# Patient Record
Sex: Female | Born: 1955 | Race: White | Hispanic: No | Marital: Married | Smoking: Never smoker
Health system: Southern US, Community
[De-identification: ages and names within clinical notes are randomized; demographics above are authoritative.]

## PROBLEM LIST (undated history)

## (undated) DIAGNOSIS — I10 Essential (primary) hypertension: Secondary | ICD-10-CM

## (undated) DIAGNOSIS — M1990 Unspecified osteoarthritis, unspecified site: Secondary | ICD-10-CM

## (undated) HISTORY — DX: Unspecified osteoarthritis, unspecified site: M19.90

## (undated) HISTORY — DX: Essential (primary) hypertension: I10

---

## 1999-09-13 ENCOUNTER — Encounter (INDEPENDENT_AMBULATORY_CARE_PROVIDER_SITE_OTHER): Payer: Self-pay | Admitting: Specialist

## 1999-09-13 ENCOUNTER — Other Ambulatory Visit: Admission: RE | Admit: 1999-09-13 | Discharge: 1999-09-13 | Payer: Self-pay | Admitting: Obstetrics and Gynecology

## 1999-10-05 ENCOUNTER — Ambulatory Visit (HOSPITAL_COMMUNITY): Admission: RE | Admit: 1999-10-05 | Discharge: 1999-10-05 | Payer: Self-pay | Admitting: Obstetrics and Gynecology

## 1999-10-05 ENCOUNTER — Encounter: Payer: Self-pay | Admitting: Obstetrics and Gynecology

## 2002-08-14 ENCOUNTER — Other Ambulatory Visit: Admission: RE | Admit: 2002-08-14 | Discharge: 2002-08-14 | Payer: Self-pay | Admitting: Obstetrics and Gynecology

## 2003-11-03 ENCOUNTER — Other Ambulatory Visit: Admission: RE | Admit: 2003-11-03 | Discharge: 2003-11-03 | Payer: Self-pay | Admitting: Obstetrics and Gynecology

## 2004-11-17 ENCOUNTER — Other Ambulatory Visit: Admission: RE | Admit: 2004-11-17 | Discharge: 2004-11-17 | Payer: Self-pay | Admitting: Obstetrics and Gynecology

## 2005-11-09 ENCOUNTER — Ambulatory Visit: Payer: Self-pay | Admitting: Internal Medicine

## 2005-11-16 ENCOUNTER — Ambulatory Visit: Payer: Self-pay | Admitting: Internal Medicine

## 2008-11-10 ENCOUNTER — Telehealth: Payer: Self-pay | Admitting: Internal Medicine

## 2008-11-17 ENCOUNTER — Ambulatory Visit: Payer: Self-pay | Admitting: Internal Medicine

## 2008-11-24 ENCOUNTER — Ambulatory Visit: Payer: Self-pay | Admitting: Internal Medicine

## 2008-11-24 LAB — HM COLONOSCOPY: HM Colonoscopy: NORMAL

## 2009-11-29 HISTORY — PX: DILATION AND CURETTAGE OF UTERUS: SHX78

## 2009-12-17 ENCOUNTER — Ambulatory Visit (HOSPITAL_COMMUNITY): Admission: RE | Admit: 2009-12-17 | Discharge: 2009-12-17 | Payer: Self-pay | Admitting: Obstetrics and Gynecology

## 2009-12-17 LAB — CONVERTED CEMR LAB: Pap Smear: NORMAL

## 2010-07-01 LAB — HM MAMMOGRAPHY: HM Mammogram: NORMAL

## 2010-07-27 ENCOUNTER — Ambulatory Visit: Payer: Self-pay | Admitting: Internal Medicine

## 2010-08-01 LAB — HM PAP SMEAR: HM Pap smear: NORMAL

## 2010-08-10 ENCOUNTER — Encounter: Payer: Self-pay | Admitting: Internal Medicine

## 2010-08-10 ENCOUNTER — Other Ambulatory Visit: Payer: Self-pay | Admitting: Internal Medicine

## 2010-08-10 ENCOUNTER — Ambulatory Visit: Payer: Self-pay | Admitting: Internal Medicine

## 2010-08-10 DIAGNOSIS — M542 Cervicalgia: Secondary | ICD-10-CM | POA: Insufficient documentation

## 2010-08-10 LAB — CBC WITH DIFFERENTIAL/PLATELET
Basophils Absolute: 0.1 K/uL (ref 0.0–0.1)
Basophils Relative: 1.2 % (ref 0.0–3.0)
Eosinophils Absolute: 0.2 K/uL (ref 0.0–0.7)
Eosinophils Relative: 4 % (ref 0.0–5.0)
HCT: 39.8 % (ref 36.0–46.0)
Hemoglobin: 13.7 g/dL (ref 12.0–15.0)
Lymphocytes Relative: 29.9 % (ref 12.0–46.0)
Lymphs Abs: 1.3 K/uL (ref 0.7–4.0)
MCHC: 34.4 g/dL (ref 30.0–36.0)
MCV: 95.5 fl (ref 78.0–100.0)
Monocytes Absolute: 0.3 K/uL (ref 0.1–1.0)
Monocytes Relative: 6.8 % (ref 3.0–12.0)
Neutro Abs: 2.5 K/uL (ref 1.4–7.7)
Neutrophils Relative %: 58.1 % (ref 43.0–77.0)
Platelets: 247 K/uL (ref 150.0–400.0)
RBC: 4.16 Mil/uL (ref 3.87–5.11)
RDW: 12.7 % (ref 11.5–14.6)
WBC: 4.3 K/uL — ABNORMAL LOW (ref 4.5–10.5)

## 2010-08-10 LAB — HEPATIC FUNCTION PANEL
ALT: 19 U/L (ref 0–35)
AST: 20 U/L (ref 0–37)
Albumin: 4.1 g/dL (ref 3.5–5.2)
Alkaline Phosphatase: 64 U/L (ref 39–117)
Bilirubin, Direct: 0.1 mg/dL (ref 0.0–0.3)
Total Bilirubin: 1.2 mg/dL (ref 0.3–1.2)
Total Protein: 6.5 g/dL (ref 6.0–8.3)

## 2010-08-10 LAB — BASIC METABOLIC PANEL
BUN: 16 mg/dL (ref 6–23)
CO2: 30 mEq/L (ref 19–32)
Calcium: 9.3 mg/dL (ref 8.4–10.5)
Chloride: 105 mEq/L (ref 96–112)
Creatinine, Ser: 0.6 mg/dL (ref 0.4–1.2)
GFR: 105.41 mL/min (ref >60.00)
Glucose, Bld: 84 mg/dL (ref 70–99)
Potassium: 4.6 mEq/L (ref 3.5–5.1)
Sodium: 142 mEq/L (ref 135–145)

## 2010-08-10 LAB — LIPID PANEL
Cholesterol: 211 mg/dL — ABNORMAL HIGH (ref 0–200)
HDL: 108.3 mg/dL
Total CHOL/HDL Ratio: 2
Triglycerides: 49 mg/dL (ref 0.0–149.0)
VLDL: 9.8 mg/dL (ref 0.0–40.0)

## 2010-08-10 LAB — LDL CHOLESTEROL, DIRECT: Direct LDL: 93.8 mg/dL

## 2010-08-10 LAB — TSH: TSH: 2.79 u[IU]/mL (ref 0.35–5.50)

## 2010-08-10 NOTE — Progress Notes (Signed)
Subjective:     Patient ID: Melissa Dawson is a 55 y.o. female.  HPI Patient is here for physical exam. She feels well on side of neck discomfort. She has some posterior neck discomfort for several months. The following portions of the patient's history were reviewed and updated as appropriate: allergies, current medications, past family history, past medical history, past social history, past surgical history and problem list.  Review of Systems  Constitutional: Negative for activity change, appetite change and fatigue.  HENT: Negative for hearing loss, nosebleeds, congestion, neck pain and sinus pressure.   Eyes: Negative for discharge and visual disturbance.  Respiratory: Negative for cough, chest tightness and wheezing.   Cardiovascular: Negative for chest pain and leg swelling.  Gastrointestinal: Negative for abdominal pain and blood in stool.  Genitourinary: Negative for dysuria, frequency, hematuria and difficulty urinating.  Neurological: Negative for dizziness, numbness and headaches.  Psychiatric/Behavioral: Negative for behavioral problems and self-injury.     Objective:   Physical Exam    Well-developed well-nourished female in no acute distress. HEENT exam atraumatic, normocephalic symmetrical muscles are intact. Neck is supple without lymphadenopathy or thyromegaly. Chest clear to auscultation cardiac exam S1-S2 regular. Abdomen; soft, soft, nontender. Extremities no clubbing cyanosis or edema. Neurologic is regular in rate without any motor or sensory deficits. Assessment:  Patient is here for well visit. Health maintenance issues are up to date. Colonoscopy, mammogram, Pap smear as her today. Tetanus immunization given today. For the shingles. He'll be given directly to 16 as otherwise indicated.  Neck discomfort. Has been ongoing and will check neck x-ray. Physical exam is unremarkable.

## 2010-08-29 DIAGNOSIS — M542 Cervicalgia: Secondary | ICD-10-CM | POA: Insufficient documentation

## 2010-09-02 NOTE — Assessment & Plan Note (Signed)
Summary: to be re-est/pt will come in fasting/njr----PT [facility] [place] // RS   Vital Signs:  Patient profile:   55 year old female Height:      64 inches Weight:      173 pounds BMI:     29.80 Temp:     97.8 degrees F oral Pulse rate:   76 / minute Pulse rhythm:   regular BP sitting:   110 / 80  (left arm) Cuff size:   regular  Vitals Entered By: Alfred Levins, CMA (August 10, 2010 8:10 AM) CC: cpx, fasting   CC:  cpx and fasting.  History of Present Illness: see [facility] note  Patient ID: Melissa Dawson is a 55 y.o. female.  HPI Patient is here for physical exam. She feels well on side of neck discomfort. She has some posterior neck discomfort for several months. The following portions of the patient's history were reviewed and updated as appropriate: allergies, current medications, past family history, past medical history, past social history, past surgical history and problem list.  Review of Systems  Constitutional: Negative for activity change, appetite change and fatigue.  HENT: Negative for hearing loss, nosebleeds, congestion, neck pain and sinus pressure.   Eyes: Negative for discharge and visual disturbance.  Respiratory: Negative for cough, chest tightness and wheezing.   Cardiovascular: Negative for chest pain and leg swelling.  Gastrointestinal: Negative for abdominal pain and blood in stool.  Genitourinary: Negative for dysuria, frequency, hematuria and difficulty urinating.  Neurological: Negative for dizziness, numbness and headaches.  Psychiatric/Behavioral: Negative for behavioral problems and self-injury.     Objective:  Physical Exam    Well-developed well-nourished female in no acute distress. HEENT exam atraumatic, normocephalic symmetrical muscles are intact. Neck is supple without lymphadenopathy or thyromegaly. Chest clear to auscultation cardiac exam S1-S2 regular. Abdomen; soft, soft, nontender. Extremities no clubbing cyanosis or edema. Neurologic is  regular in rate without any motor or sensory deficits. Assessment: Patient is here for well visit. Health maintenance issues are up to date. Colonoscopy, mammogram, Pap smear as her today. Tetanus immunization given today. For the shingles. He'll be given directly to 16 as otherwise indicated.  Neck discomfort. Has been ongoing and will check neck x-ray. Physical exam is unremarkable.        Preventive Screening-Counseling & Management  Alcohol-Tobacco     Smoking Status: never  Caffeine-Diet-Exercise     Does Patient Exercise: yes      Drug Use:  no.    Allergies (verified): No Known Drug Allergies  Past History:  Past Surgical History: Caesarean section X1 D&C 12/17/2009  Family History: Mother alive healthy Father died age 18 Lung Cancer and diabetes 2 Siblings healthy MGF Heart disease died 22  Social History: Married Never Smoked Alcohol use-yes 1 glass of red wine daily Drug use-no Regular exercise-yes 2-3 times a wk Drug Use:  no Does Patient Exercise:  yes   Impression & Recommendations:  Problem # 1:  HEALTH SCREENING (ICD-V70.0)  see [facility]  Problem # 2:  NECK PAIN (ICD-723.1) Assessment: Unchanged  Her updated medication list for this problem includes:    Aspirin 81 Mg Tbec (Aspirin) .Marland Kitchen... 1 by mouth once daily  Orders: T-Cervical Spine Comp 4 Views (72050TC)  Complete Medication List: 1)  Aspirin 81 Mg Tbec (Aspirin) .Marland Kitchen.. 1 by mouth once daily  Other Orders: Venipuncture (81191) Specimen Handling (47829) TLB-Lipid Panel (80061-LIPID) TLB-BMP (Basic Metabolic Panel-BMET) (80048-METABOL) TLB-CBC Platelet - w/Differential (85025-CBCD) TLB-Hepatic/Liver Function Pnl (80076-HEPATIC) TLB-TSH (Thyroid Stimulating  Hormone) (84443-TSH)   Orders Added: 1)  T-Cervical Spine Comp 4 Views [72050TC] 2)  Venipuncture [16109] 3)  Specimen Handling [99000] 4)  TLB-Lipid Panel [80061-LIPID] 5)  TLB-BMP (Basic Metabolic Panel-BMET)  [80048-METABOL] 6)  TLB-CBC Platelet - w/Differential [85025-CBCD] 7)  TLB-Hepatic/Liver Function Pnl [80076-HEPATIC] 8)  TLB-TSH (Thyroid Stimulating Hormone) [60454-UJW]    Preventive Care Screening  Mammogram:    Date:  07/01/2010    Results:  normal   Pap Smear:    Date:  12/17/2009    Results:  normal    Appended Document: Orders Update    Clinical Lists Changes  Orders: Added new Service order of Specimen Handling (11914) - Signed      Appended Document: to be re-est/pt will come in fasting/njr----PT [facility] // RS    Nurse Visit   Allergies: No Known Drug Allergies  Immunizations Administered:  Tetanus Vaccine:    Vaccine Type: Tdap    Site: left deltoid    Mfr: Sanofi Pasteur    Dose: 0.5 ml    Route: IM    Given by: Alfred Levins, CMA    Exp. Date: 05/20/2012    Lot #: [id number]  Orders Added: 1)  Tdap => 49yrs IM [90715] 2)  Admin 1st Vaccine [86578]

## 2010-10-18 LAB — BASIC METABOLIC PANEL
BUN: 12 mg/dL (ref 6–23)
CO2: 30 mEq/L (ref 19–32)
Calcium: 9.1 mg/dL (ref 8.4–10.5)
Chloride: 105 mEq/L (ref 96–112)
Creatinine, Ser: 0.66 mg/dL (ref 0.4–1.2)
GFR calc Af Amer: >60 mL/min (ref >60)
GFR calc non Af Amer: >60 mL/min (ref >60)
Glucose, Bld: 95 mg/dL (ref 70–99)
Potassium: 4 mEq/L (ref 3.5–5.1)
Sodium: 138 mEq/L (ref 135–145)

## 2010-10-18 LAB — CBC
HCT: 40.8 % (ref 36.0–46.0)
Hemoglobin: 14.2 g/dL (ref 12.0–15.0)
MCHC: 34.9 g/dL (ref 30.0–36.0)
MCV: 94.9 fL (ref 78.0–100.0)
Platelets: 258 10*3/uL (ref 150–400)
RBC: 4.3 MIL/uL (ref 3.87–5.11)
RDW: 11.8 % (ref 11.5–15.5)
WBC: 4.4 10*3/uL (ref 4.0–10.5)

## 2011-04-18 ENCOUNTER — Emergency Department (HOSPITAL_COMMUNITY): Payer: 59

## 2011-04-18 ENCOUNTER — Observation Stay (HOSPITAL_COMMUNITY)
Admission: EM | Admit: 2011-04-18 | Discharge: 2011-04-19 | Disposition: A | Discharge status: Home / Self Care | Payer: 59 | Attending: Cardiology | Admitting: Cardiology

## 2011-04-18 DIAGNOSIS — R079 Chest pain, unspecified: Principal | ICD-10-CM | POA: Insufficient documentation

## 2011-04-18 DIAGNOSIS — I10 Essential (primary) hypertension: Secondary | ICD-10-CM | POA: Insufficient documentation

## 2011-04-18 DIAGNOSIS — R209 Unspecified disturbances of skin sensation: Secondary | ICD-10-CM | POA: Insufficient documentation

## 2011-04-18 LAB — COMPREHENSIVE METABOLIC PANEL
ALT: 15 U/L (ref 0–35)
Albumin: 4 g/dL (ref 3.5–5.2)
Calcium: 9.2 mg/dL (ref 8.4–10.5)
GFR calc Af Amer: >60 mL/min (ref >60)
Glucose, Bld: 104 mg/dL — ABNORMAL HIGH (ref 70–99)
Sodium: 140 mEq/L (ref 135–145)
Total Protein: 6.6 g/dL (ref 6.0–8.3)

## 2011-04-18 LAB — DIFFERENTIAL
Basophils Absolute: 0 10*3/uL (ref 0.0–0.1)
Basophils Relative: 1 % (ref 0–1)
Eosinophils Absolute: 0.1 10*3/uL (ref 0.0–0.7)
Eosinophils Relative: 3 % (ref 0–5)
Neutrophils Relative %: 53 % (ref 43–77)

## 2011-04-18 LAB — CBC
MCV: 90.2 fL (ref 78.0–100.0)
Platelets: 232 10*3/uL (ref 150–400)
RDW: 11.9 % (ref 11.5–15.5)
WBC: 4.2 10*3/uL (ref 4.0–10.5)

## 2011-04-18 LAB — URINALYSIS, ROUTINE W REFLEX MICROSCOPIC
Ketones, ur: NEGATIVE mg/dL
Leukocytes, UA: NEGATIVE
Nitrite: NEGATIVE
Protein, ur: NEGATIVE mg/dL
pH: 7.5 (ref 5.0–8.0)

## 2011-04-18 LAB — CK TOTAL AND CKMB (NOT AT ARMC)
Relative Index: 2.9 — ABNORMAL HIGH (ref 0.0–2.5)
Total CK: 181 U/L — ABNORMAL HIGH (ref 7–177)

## 2011-04-18 LAB — HEMOGLOBIN A1C
Hgb A1c MFr Bld: 5.3 % (ref <5.7)
Mean Plasma Glucose: 105 mg/dL (ref <117)

## 2011-04-18 LAB — POCT I-STAT TROPONIN I: Troponin i, poc: 0.01 ng/mL (ref 0.00–0.08)

## 2011-04-18 IMAGING — CR DG CHEST 2V
2 series · 2 of 2 positions shown · non-contrast
Comparison: None

CLINICAL DATA: Chest pain.

CHEST - 2 VIEW

[w chest pa]
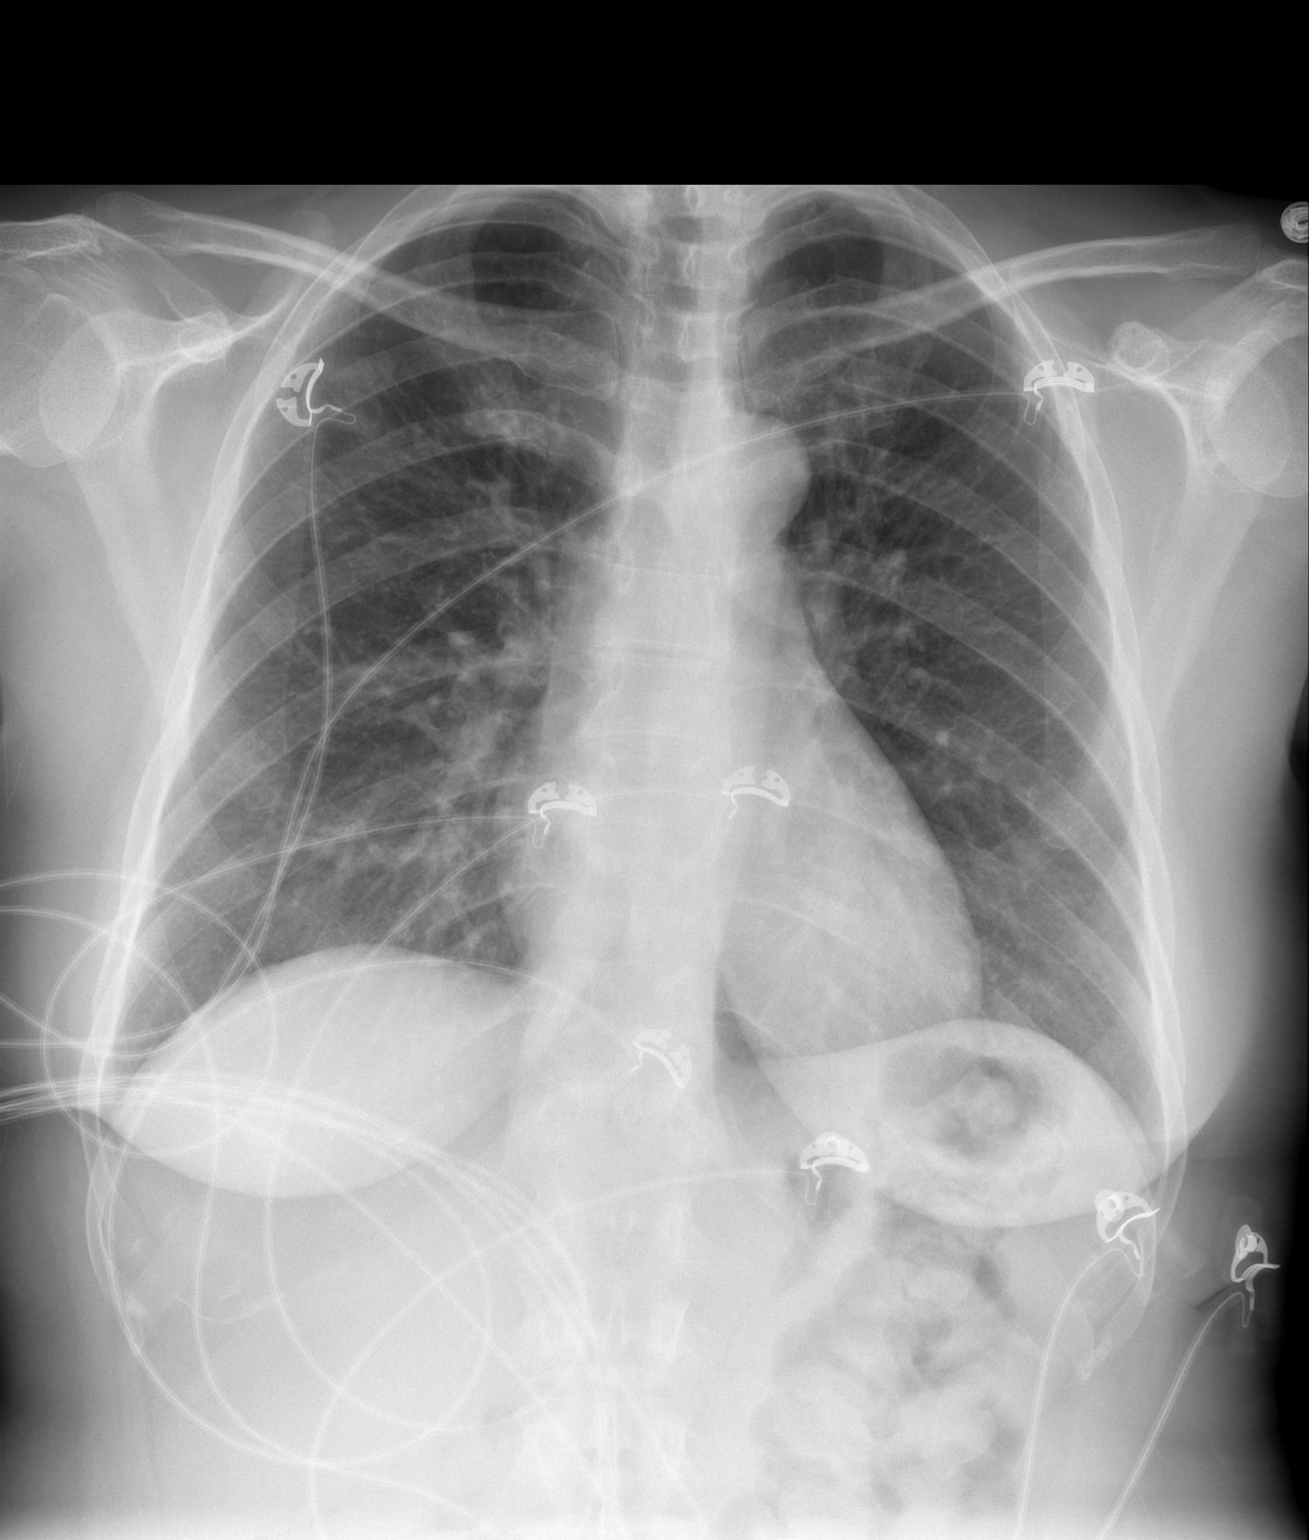

[w chest lat]
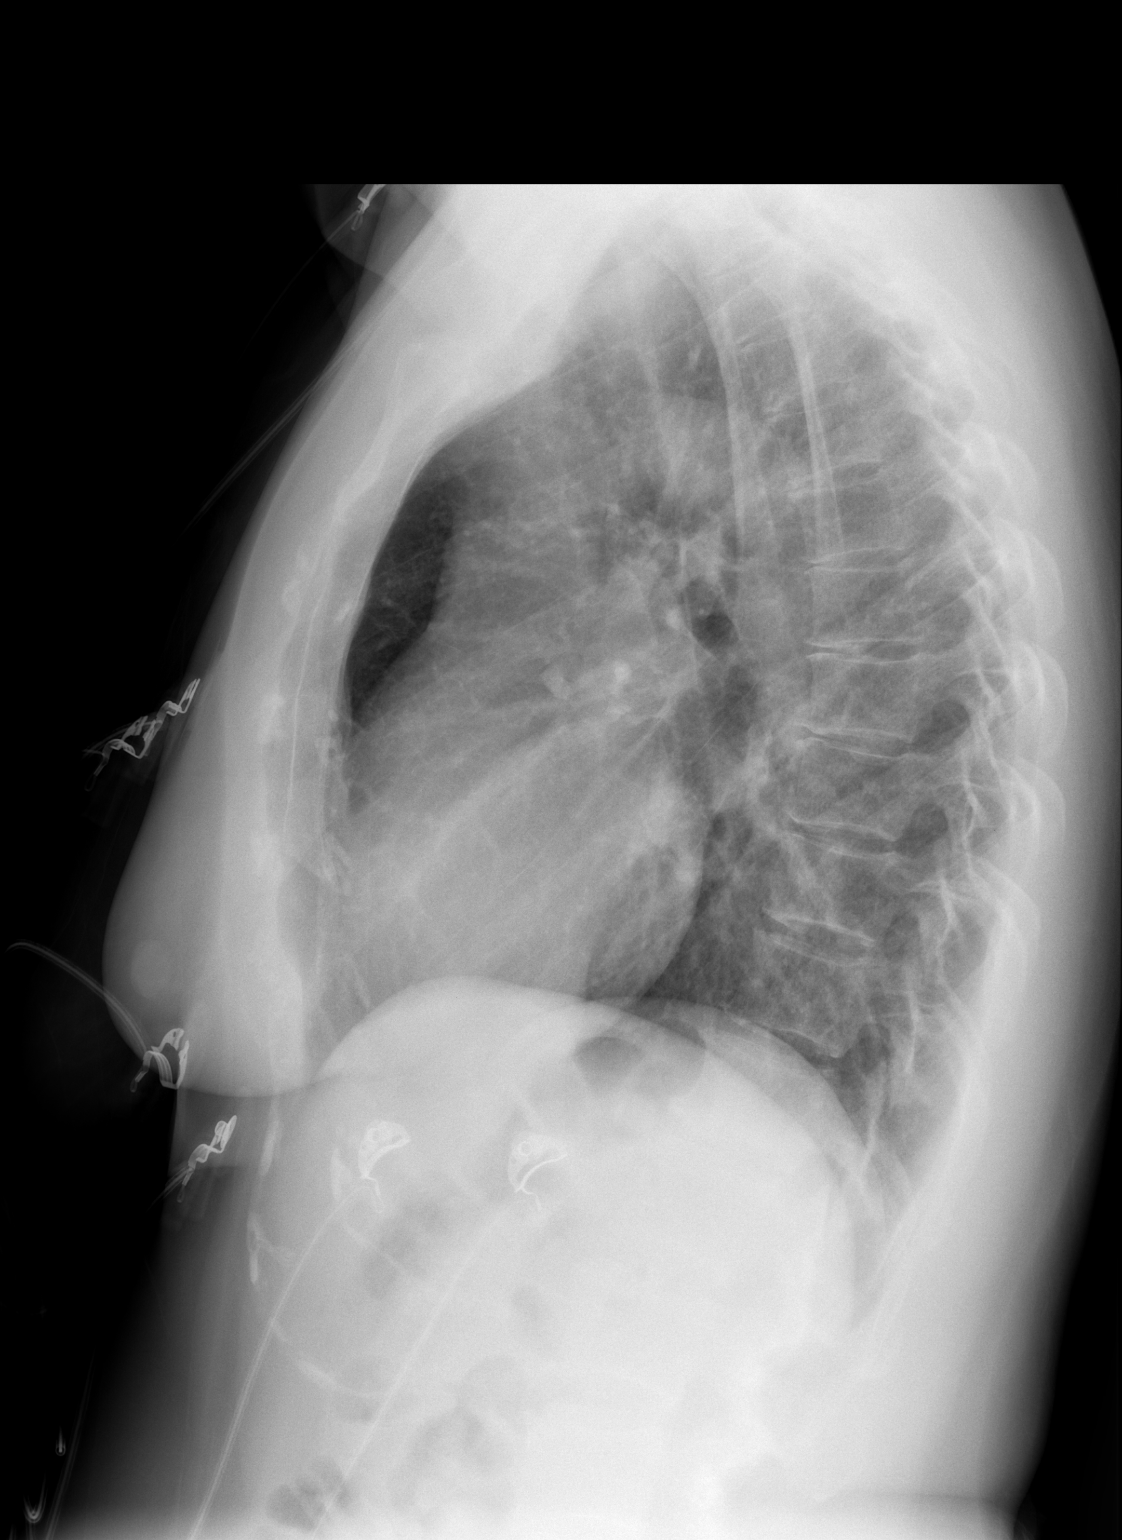

[2 of 2 positions shown; findings below may reference images not displayed]

FINDINGS: The cardiac silhouette, mediastinal and hilar contours
are within normal limits.  There are bronchitic type lung changes
which could reflect bronchitis or interstitial pneumonitis.  No
infiltrates, edema or effusions.  The bony thorax is intact.
IMPRESSION: Mild bronchitic lung changes may suggest bronchitis.  No focal
infiltrates.

## 2011-04-19 ENCOUNTER — Observation Stay (HOSPITAL_COMMUNITY): Payer: 59

## 2011-04-19 LAB — CARDIAC PANEL(CRET KIN+CKTOT+MB+TROPI)
CK, MB: 3.4 ng/mL (ref 0.3–4.0)
Troponin I: <0.3 ng/mL (ref <0.30)

## 2011-04-19 LAB — LIPID PANEL
Cholesterol: 196 mg/dL (ref 0–200)
HDL: 106 mg/dL (ref >39)
Total CHOL/HDL Ratio: 1.8 RATIO
Triglycerides: 43 mg/dL (ref <150)

## 2011-04-19 LAB — URINE CULTURE

## 2011-04-19 MED ORDER — TECHNETIUM TC 99M TETROFOSMIN IV KIT
10.0000 | PACK | Freq: Once | INTRAVENOUS | Status: AC | PRN
  Administered 2011-04-19: 10 via INTRAVENOUS

## 2011-04-19 MED ORDER — TECHNETIUM TC 99M TETROFOSMIN IV KIT
30.0000 | PACK | Freq: Once | INTRAVENOUS | Status: AC | PRN
  Administered 2011-04-19: 30 via INTRAVENOUS

## 2011-05-03 NOTE — Discharge Summary (Signed)
  NAMEZEANNA, Melissa Dawson NO.:  [id number]  MEDICAL RECORD NO.:  [id number]  LOCATION:  2030                         FACILITY:  [facility]  PHYSICIAN:  Thereasa Solo. Little, M.D. DATE OF BIRTH:  12-27-1955  DATE OF ADMISSION:  04/18/2011 DATE OF DISCHARGE:  04/19/2011                              DISCHARGE SUMMARY   DISCHARGE DIAGNOSES: 1. Chest pain, negative myocardial infarction, negative stress     Myoview. 2. Hypertension, new diagnosis and may be partial cause of chest pain,     now treated. 3. Bronchitic changes on chest x-ray.  DISCHARGE CONDITION:  Stable, improved.  DISCHARGE MEDICATIONS:  See medication reconciliation sheet.  DISCHARGE INSTRUCTIONS: 1. Increase activity slowly. 2. Heart-healthy diet. 3. Follow up with Dr. Clarene Duke on May 02, 2011, at 4:00 p.m.  HISTORY OF PRESENT ILLNESS AND HOSPITAL COURSE:  A 55 year old female who presented to the emergency room April 18, 2011, developed left arm numbness for a period of longer than usual.  She usually has this happened when she lies on her right side, then she was at work on the 17th and felt faint, nauseated, chest pressure.  She thought she was going to have an episode of some type.  Pressure was 8/10 on a 1-10 scale, pulse was 60, blood pressure by EMS was 180/122 and in the ER she was 137/91, no palpitations.  No visual changes and she played tennis this past Saturday without any symptoms at all.  The patient was seen by Dr. Clarene Duke and admitted for further evaluation. EKG with sinus rhythm.  No acute changes.  She underwent a stress Myoview, and actually achieved 100% of predicted maximal heart rate without any chest pressure or any symptoms.  She was mildly short of breath.  Myoview results revealed no fixed or reversible defects to suggest inducible ischemia, normal EF at 67%.  Chest x-ray, mild bronchitic changes, may suggest bronchitis.  No focal infiltrates.  The patient will be  discharged with Norvasc 5 mg daily.  As prior to discharge now, her blood pressure is 158/94, unsure what is causing the hypertension.  Please note nuclear study results were not back till after 4:00 p.m. and within 20 minutes of that papers were being prepared for discharge.  Her husband was very angry and stated he had been waiting 4 hours.  We did not have the results until after 4:00 p.m.  Dr. Herbie Baltimore will see them both prior to their discharge.  Other laboratory data; total cholesterol 196, triglycerides 43, HDL 106 and LDL 81.  Urine culture was done with no growth.  Cardiac enzymes were all negative.  Hemoglobin A1c was 5.3.  TSH was 2.344.  ProBNP on admission was 50.5.  Please note on admission; CK was 181, MB was 5.2 which was slightly elevated but again negative troponin I.  LFTs were normal as well.  The patient will follow up as instructed.     Darcella Gasman. Ingold, N.P.   ______________________________ Thereasa Solo Little, M.D.    LRI/MEDQ  D:  04/19/2011  T:  04/19/2011  Job:  [id number]  Electronically Signed by Nada Boozer N.P. on 04/22/2011 03:14:14 PM Electronically Signed by Julieanne Manson M.D. on 05/03/2011 11:26:42 AM

## 2011-05-19 ENCOUNTER — Telehealth: Payer: Self-pay | Admitting: *Deleted

## 2011-05-19 DIAGNOSIS — M542 Cervicalgia: Secondary | ICD-10-CM

## 2011-05-19 NOTE — Telephone Encounter (Signed)
[facility] called and pt showed up with an order from [facility] for a Cspine xray.  She seen Dr Cato Mulligan in January for neck pain. Please advise if you still want pt to have this

## 2011-05-20 NOTE — Telephone Encounter (Signed)
ok 

## 2011-05-20 NOTE — Telephone Encounter (Signed)
Order placed, pt aware 

## 2011-05-25 ENCOUNTER — Ambulatory Visit (INDEPENDENT_AMBULATORY_CARE_PROVIDER_SITE_OTHER)
Admission: RE | Admit: 2011-05-25 | Discharge: 2011-05-25 | Disposition: A | Discharge status: Home / Self Care | Payer: 59 | Source: Ambulatory Visit | Attending: Internal Medicine | Admitting: Internal Medicine

## 2011-05-25 DIAGNOSIS — M542 Cervicalgia: Secondary | ICD-10-CM

## 2011-10-12 ENCOUNTER — Other Ambulatory Visit (INDEPENDENT_AMBULATORY_CARE_PROVIDER_SITE_OTHER): Payer: 59

## 2011-10-12 DIAGNOSIS — Z0000 Encounter for general adult medical examination without abnormal findings: Secondary | ICD-10-CM

## 2011-10-12 LAB — CBC WITH DIFFERENTIAL/PLATELET
Basophils Absolute: 0.1 10*3/uL (ref 0.0–0.1)
HCT: 39.9 % (ref 36.0–46.0)
Hemoglobin: 13.3 g/dL (ref 12.0–15.0)
Lymphs Abs: 1.3 10*3/uL (ref 0.7–4.0)
MCHC: 33.4 g/dL (ref 30.0–36.0)
Monocytes Relative: 7.5 % (ref 3.0–12.0)
Neutro Abs: 1.8 10*3/uL (ref 1.4–7.7)
RDW: 13.4 % (ref 11.5–14.6)

## 2011-10-12 LAB — BASIC METABOLIC PANEL
CO2: 27 mEq/L (ref 19–32)
GFR: 76.34 mL/min (ref >60.00)
Glucose, Bld: 81 mg/dL (ref 70–99)
Potassium: 4.5 mEq/L (ref 3.5–5.1)
Sodium: 139 mEq/L (ref 135–145)

## 2011-10-12 LAB — POCT URINALYSIS DIPSTICK
Bilirubin, UA: NEGATIVE
Glucose, UA: NEGATIVE
Ketones, UA: NEGATIVE
Leukocytes, UA: NEGATIVE

## 2011-10-12 LAB — HEPATIC FUNCTION PANEL
ALT: 17 U/L (ref 0–35)
AST: 21 U/L (ref 0–37)
Albumin: 4.1 g/dL (ref 3.5–5.2)
Total Protein: 6.4 g/dL (ref 6.0–8.3)

## 2011-10-12 LAB — TSH: TSH: 1.68 u[IU]/mL (ref 0.35–5.50)

## 2011-10-12 LAB — LIPID PANEL: HDL: 105.8 mg/dL (ref >39.00)

## 2011-10-19 ENCOUNTER — Encounter: Payer: Self-pay | Admitting: Internal Medicine

## 2011-10-19 ENCOUNTER — Ambulatory Visit (INDEPENDENT_AMBULATORY_CARE_PROVIDER_SITE_OTHER): Payer: 59 | Admitting: Internal Medicine

## 2011-10-19 VITALS — BP 128/78 | HR 80 | Temp 98.3°F | Ht 64.0 in | Wt 167.0 lb

## 2011-10-19 DIAGNOSIS — Z0000 Encounter for general adult medical examination without abnormal findings: Secondary | ICD-10-CM

## 2011-10-19 MED ORDER — AMLODIPINE BESYLATE 5 MG PO TABS: 2.5000 mg | ORAL_TABLET | Freq: Every day | ORAL | Status: DC

## 2011-10-19 NOTE — Patient Instructions (Signed)
Carpal Tunnel Syndrome The carpal tunnel is a narrow hollow area in the wrist. It is formed by the wrist bones and ligaments. Nerves, blood vessels, and tendons (cord like structures which attach muscle to bone) on the palm side (the side of your hand in the direction your fingers bend) of your hand pass through the carpal tunnel. Repeated wrist motion or certain diseases may cause swelling within the tunnel. (That is why these are called repetitive trauma (damage caused by over use) disorders. It is also a common problem in late pregnancy.) This swelling pinches the main nerve in the wrist (median nerve) and causes the painful condition called carpal tunnel syndrome. A feeling of "pins and needles" may be noticed in the fingers or hand; however, the entire arm may ache from this condition. Carpal tunnel syndrome may clear up by itself. Cortisone injections may help. Sometimes, an operation may be needed to free the pinched nerve. An electromyogram (a type of test) may be needed to confirm this diagnosis (learning what is wrong). This is a test which measures nerve conduction. The nerve conduction is usually slowed in a carpal tunnel syndrome. HOME CARE INSTRUCTIONS   If your caregiver prescribed medication to help reduce swelling, take as directed.   If you were given a splint to keep your wrist from bending, use it as instructed. It is important to wear the splint at night. Use the splint for as long as you have pain or numbness in your hand, arm or wrist. This may take 1 to 2 months.   If you have pain at night, it may help to rub or shake your hand, or elevate your hand above the level of your heart (the center of your chest).   It is important to give your wrist a rest by stopping the activities that are causing the problem. If your symptoms (problems) are work-related, you may need to talk to your employer about changing to a job that does not require using your wrist.   Only take over-the-counter  or prescription medicines for pain, discomfort, or fever as directed by your caregiver.   Following periods of extended use, particularly strenuous use, apply an ice pack wrapped in a towel to the anterior (palm) side of the affected wrist for 20 to 30 minutes. Repeat as needed three to four times per day. This will help reduce the swelling.   Follow all instructions for follow-up with your caregiver. This includes any orthopedic referrals, physical therapy, and rehabilitation. Any delay in obtaining necessary care could result in a delay or failure of your condition to heal.  SEEK IMMEDIATE MEDICAL CARE IF:   You are still having pain and numbness following a week of treatment.   You develop new, unexplained symptoms.   Your current symptoms are getting worse and are not helped or controlled with medications.  MAKE SURE YOU:   Understand these instructions.   Will watch your condition.   Will get help right away if you are not doing well or get worse.  Document Released: 07/15/2000 Document Revised: 07/07/2011 Document Reviewed: 06/03/2011 Exit[employer] Patient Information 2012 [employer]. 

## 2011-10-19 NOTE — Progress Notes (Deleted)
  Subjective:    Patient ID: Melissa Dawson, female    DOB: 1955/10/26, 56 y.o.   MRN: [id number]  HPI    Review of Systems     Objective:   Physical Exam        Assessment & Plan:

## 2011-10-19 NOTE — Progress Notes (Signed)
Patient ID: Melissa Dawson, female   DOB: Mar 12, 1956, 56 y.o.   MRN: [id number] cpx  Has been diagnosed with HTN--followed by dr. little No past medical history on file.  History   Social History  . Marital Status: Married    Spouse Name: N/A    Number of Children: N/A  . Years of Education: N/A   Occupational History  . accountant    Social History Main Topics  . Smoking status: Never Smoker   . Smokeless tobacco: Not on file  . Alcohol Use: 4.2 oz/week    7 Glasses of wine per week  . Drug Use:   . Sexually Active:    Other Topics Concern  . Not on file   Social History Narrative  . No narrative on file    Past Surgical History  Procedure Date  . Cesarean section   . Dilation and curettage of uterus may 2011    fibroids and polyps    Family History  Problem Relation Age of Onset  . Diabetes Father   . Lung cancer Father     No Known Allergies  Current Outpatient Prescriptions on File Prior to Visit  Medication Sig Dispense Refill  . aspirin 81 MG tablet Take 81 mg by mouth daily.        . multivitamin (THERAGRAN) per tablet Take 1 tablet by mouth daily.           patient denies chest pain, shortness of breath, orthopnea. Denies lower extremity edema, abdominal pain, change in appetite, change in bowel movements. Patient denies rashes, musculoskeletal complaints. No other specific complaints in a complete review of systems.   BP 128/78  Pulse 80  Temp(Src) 98.3 F (36.8 C) (Oral)  Ht 5\' 4"  (1.626 m)  Wt 167 lb (75.751 kg)  BMI 28.67 kg/m2  Well-developed well-nourished female in no acute distress. HEENT exam atraumatic, normocephalic, extraocular muscles are intact. Neck is supple. No jugular venous distention no thyromegaly. Chest clear to auscultation without increased work of breathing. Cardiac exam S1 and S2 are regular. Abdominal exam active bowel sounds, soft, nontender. Extremities no edema. Neurologic exam she is alert without any motor  sensory deficits. Gait is normal.  A/P- well visit- health maint UTD I suspect she has CTS by her hx of nocturnal upper extremity tingliing--she will try wrist braces

## 2011-12-12 LAB — HM PAP SMEAR: HM Pap smear: NEGATIVE

## 2012-11-02 ENCOUNTER — Other Ambulatory Visit: Payer: Self-pay | Admitting: Internal Medicine

## 2012-11-05 ENCOUNTER — Telehealth: Payer: Self-pay | Admitting: Internal Medicine

## 2012-11-05 MED ORDER — AMLODIPINE BESYLATE 5 MG PO TABS: 2.5000 mg | ORAL_TABLET | Freq: Every day | ORAL | Status: DC

## 2012-11-05 NOTE — Telephone Encounter (Signed)
3 mth supply sent in electroncially

## 2012-11-05 NOTE — Telephone Encounter (Signed)
Pt nees refill of amLODipine (NORVASC) 5 MG tablet.  Pt aware she needs appt and pt has made appt for her cpe on 01/15/13. Pharm: [facility]

## 2013-01-08 ENCOUNTER — Other Ambulatory Visit (INDEPENDENT_AMBULATORY_CARE_PROVIDER_SITE_OTHER): Payer: 59

## 2013-01-08 DIAGNOSIS — Z0000 Encounter for general adult medical examination without abnormal findings: Secondary | ICD-10-CM

## 2013-01-08 LAB — CBC WITH DIFFERENTIAL/PLATELET
Basophils Absolute: 0.1 10*3/uL (ref 0.0–0.1)
Eosinophils Absolute: 0.2 10*3/uL (ref 0.0–0.7)
HCT: 40.3 % (ref 36.0–46.0)
Hemoglobin: 13.9 g/dL (ref 12.0–15.0)
Lymphocytes Relative: 36.8 % (ref 12.0–46.0)
Lymphs Abs: 1.5 10*3/uL (ref 0.7–4.0)
MCHC: 34.5 g/dL (ref 30.0–36.0)
Neutro Abs: 2 10*3/uL (ref 1.4–7.7)
RDW: 12.2 % (ref 11.5–14.6)

## 2013-01-08 LAB — HEPATIC FUNCTION PANEL
ALT: 18 U/L (ref 0–35)
AST: 20 U/L (ref 0–37)
Bilirubin, Direct: 0.1 mg/dL (ref 0.0–0.3)
Total Bilirubin: 0.9 mg/dL (ref 0.3–1.2)

## 2013-01-08 LAB — BASIC METABOLIC PANEL
Calcium: 9.4 mg/dL (ref 8.4–10.5)
Creatinine, Ser: 0.8 mg/dL (ref 0.4–1.2)

## 2013-01-08 LAB — POCT URINALYSIS DIPSTICK
Bilirubin, UA: NEGATIVE
Blood, UA: NEGATIVE
Glucose, UA: NEGATIVE
Ketones, UA: NEGATIVE
Spec Grav, UA: 1.015

## 2013-01-08 LAB — LIPID PANEL
Cholesterol: 188 mg/dL (ref 0–200)
Triglycerides: 49 mg/dL (ref 0.0–149.0)

## 2013-01-15 ENCOUNTER — Ambulatory Visit (INDEPENDENT_AMBULATORY_CARE_PROVIDER_SITE_OTHER): Payer: 59 | Admitting: Internal Medicine

## 2013-01-15 ENCOUNTER — Encounter: Payer: Self-pay | Admitting: Internal Medicine

## 2013-01-15 VITALS — BP 128/84 | HR 72 | Temp 98.1°F | Ht 64.0 in | Wt 164.0 lb

## 2013-01-15 DIAGNOSIS — I10 Essential (primary) hypertension: Secondary | ICD-10-CM

## 2013-01-15 DIAGNOSIS — Z0000 Encounter for general adult medical examination without abnormal findings: Secondary | ICD-10-CM

## 2013-01-15 NOTE — Progress Notes (Signed)
Patient ID: Melissa Dawson, female   DOB: Oct 16, 1955, 57 y.o.   MRN: [id number]  cpx  Past Medical History  Diagnosis Date  . Arthritis   . Hypertension     History   Social History  . Marital Status: Married    Spouse Name: N/A    Number of Children: N/A  . Years of Education: N/A   Occupational History  . accountant    Social History Main Topics  . Smoking status: Never Smoker   . Smokeless tobacco: Not on file  . Alcohol Use: 0.6 oz/week    1 Glasses of wine per week  . Drug Use: Not on file  . Sexually Active: Not on file   Other Topics Concern  . Not on file   Social History Narrative  . No narrative on file    Past Surgical History  Procedure Laterality Date  . Cesarean section    . Dilation and curettage of uterus  may 2011    fibroids and polyps    Family History  Problem Relation Age of Onset  . Diabetes Father   . Lung cancer Father     No Known Allergies  Current Outpatient Prescriptions on File Prior to Visit  Medication Sig Dispense Refill  . amLODipine (NORVASC) 5 MG tablet Take 0.5 tablets (2.5 mg total) by mouth daily.  45 tablet  0  . aspirin 81 MG tablet Take 81 mg by mouth daily.        . multivitamin (THERAGRAN) per tablet Take 1 tablet by mouth daily.         No current facility-administered medications on file prior to visit.     patient denies chest pain, shortness of breath, orthopnea. Denies lower extremity edema, abdominal pain, change in appetite, change in bowel movements. Patient denies rashes, musculoskeletal complaints. No other specific complaints in a complete review of systems.   BP 128/84  Pulse 72  Temp(Src) 98.1 F (36.7 C) (Oral)  Ht 5\' 4"  (1.626 m)  Wt 164 lb (74.39 kg)  BMI 28.14 kg/m2   Well-developed well-nourished female in no acute distress. HEENT exam atraumatic, normocephalic, extraocular muscles are intact. Neck is supple. No jugular venous distention no thyromegaly. Chest clear to auscultation  without increased work of breathing. Cardiac exam S1 and S2 are regular. Abdominal exam active bowel sounds, soft, nontender. Extremities no edema. Neurologic exam she is alert without any motor sensory deficits. Gait is normal.   Well visit- health maint UTD

## 2013-02-15 ENCOUNTER — Other Ambulatory Visit: Payer: Self-pay | Admitting: Internal Medicine

## 2013-03-26 ENCOUNTER — Encounter: Payer: Self-pay | Admitting: Internal Medicine

## 2014-03-02 ENCOUNTER — Other Ambulatory Visit: Payer: Self-pay | Admitting: Internal Medicine

## 2014-03-04 MED ORDER — AMLODIPINE BESYLATE 5 MG PO TABS: ORAL_TABLET | ORAL | Status: DC

## 2014-03-04 NOTE — Addendum Note (Signed)
Addended by: Alfred LevinsWYRICK, CINDY D on: 03/04/2014 11:13 AM   Modules accepted: Orders

## 2014-03-04 NOTE — Telephone Encounter (Signed)
Pt has made an appt with dr hunter for oct 2015. Pt would like a refill on amlodipine until then

## 2014-05-19 ENCOUNTER — Ambulatory Visit (INDEPENDENT_AMBULATORY_CARE_PROVIDER_SITE_OTHER): Payer: 59 | Admitting: Family Medicine

## 2014-05-19 ENCOUNTER — Encounter: Payer: Self-pay | Admitting: Family Medicine

## 2014-05-19 VITALS — BP 128/88 | HR 60 | Temp 98.3°F | Wt 164.0 lb

## 2014-05-19 DIAGNOSIS — R200 Anesthesia of skin: Secondary | ICD-10-CM | POA: Insufficient documentation

## 2014-05-19 DIAGNOSIS — R0989 Other specified symptoms and signs involving the circulatory and respiratory systems: Secondary | ICD-10-CM | POA: Insufficient documentation

## 2014-05-19 DIAGNOSIS — R208 Other disturbances of skin sensation: Secondary | ICD-10-CM

## 2014-05-19 DIAGNOSIS — I10 Essential (primary) hypertension: Secondary | ICD-10-CM

## 2014-05-19 MED ORDER — AMLODIPINE BESYLATE 5 MG PO TABS: ORAL_TABLET | ORAL | Status: DC

## 2014-05-19 NOTE — Assessment & Plan Note (Signed)
Well controlled on amlodipine 2.5mg  daily-continue.

## 2014-05-19 NOTE — Assessment & Plan Note (Signed)
Bruit noted on exam. Patient states has been there for years and had an ultrasound with Dr. Cato MulliganSwords which I cannot find but patient seems to say this was due to atherosclerosis. Discussed need to repeat this especially if left foot numbness progressed and patient agreeable.

## 2014-05-19 NOTE — Patient Instructions (Signed)
See me in 6 months with bloodwork a week before for your physical  We would be happy to do your remaining paps which would not be that many as long as things were normal.   BP meds x 1 year refilled.   Health Maintenance Due  Topic Date Due  . Pap Smear - try to figure out when your last one was and if you want us to take over we would need a copy of your most recent paps (x3).  08/01/2013

## 2014-05-19 NOTE — Assessment & Plan Note (Signed)
Intermittent primarily with playing tennis. Patient has a hard time describing but this has not worsened in year it has been present (actually was present before last physical and mentioned to Dr. Cato MulliganSwords so >1 year). Discussed could be nerve issue or CV-consider repeat u/s vs. Referral to sports medicine. Patient opted to simply follow for now and will return to care if worsens. Otherwise check in 6 months from now.

## 2014-05-19 NOTE — Progress Notes (Signed)
Melissa ConchStephen Hunter, MD Phone: [phone]  Subjective:  Patient presents today to establish care with me as their new primary care provider. Patient was formerly a patient of Dr. Cato MulliganSwords. Chief complaint-noted.   Hypertension-well controlled on repeat  BP Readings from Last 3 Encounters:  05/19/14 128/88  01/15/13 128/84  10/19/11 128/78  Home BP monitoring-recent check in last 3 months at gynecologist was in 120s over 70s. Admits to a lot of stress today.  Compliant with medications-yes without side effects ROS-Denies any CP, HA, SOB, blurry vision, LE edema.  Left foot numbness-new Abdominal bruit-reportedly old but not documented in problem list/new Left leg gets slightly numb as she plays tennis since at least 11/2012. Doesn't feel like she is going to fall or anything. Not progressing. Feels heavy but able to play her normal game. 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 No issues with hands or right foot. Does mention history of aortic bruit which she has had ultrasounded (cannot find records).  ROS-no back pain, no fecal or urinary incontinence  The following were reviewed and entered/updated in epic: Past Medical History  Diagnosis Date  . Arthritis     cervical (neck)  . Hypertension    Patient Active Problem List   Diagnosis Date Noted  . Abdominal bruit 05/19/2014  . Numbness of left foot 05/19/2014  . Hypertension 01/15/2013   Past Surgical History  Procedure Laterality Date  . Cesarean section    . Dilation and curettage of uterus  may 2011    fibroids and polyps    Family History  Problem Relation Age of Onset  . Diabetes Father 4960    heavy drinker  . Lung cancer Father     smoker    Medications- reviewed and updated Current Outpatient Prescriptions  Medication Sig Dispense Refill  . amLODipine (NORVASC) 5 MG tablet TAKE ONE-HALF (1/2) TABLET BY MOUTH DAILY - NEED OFFICE VISIT  45 tablet  3  . aspirin 81 MG tablet Take 81 mg by mouth daily.          . multivitamin (THERAGRAN) per tablet Take 1 tablet by mouth daily.         No current facility-administered medications for this visit.    Allergies-reviewed and updated No Known Allergies  History   Social History  . Marital Status: Married    Spouse Name: N/A    Number of Children: N/A  . Years of Education: N/A   Occupational History  . accountant    Social History Main Topics  . Smoking status: Never Smoker   . Smokeless tobacco: None  . Alcohol Use: 0.6 oz/week    1 Glasses of wine per week  . Drug Use: No  . Sexual Activity: None   Other Topics Concern  . None   Social History Narrative   Married 1985 (Rian). 2 daughters. 1 granddog. 56 year old dog-mutt.    Downsizing home in 2015.       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 Oropharynx normal CV: RRR no murmurs rubs or gallops Lungs: CTAB no crackles, wheeze, rhonchi Abdomen: soft/nontender/nondistended/normal bowel sounds. No rebound or guarding. Patient does have a bruit noted in  abdomen above umbilicus.  Ext: no edema, 2+ PT and DP pulses bilaterally Skin: warm, dry Neuro: normal strength and reflexes in lower extremities  Assessment/Plan:  Hypertension Well controlled on amlodipine 2.5mg  daily-continue.   Abdominal bruit Bruit noted on exam. Patient states has been there for years and had an ultrasound with Dr. Cato MulliganSwords which I cannot find but patient seems to say this was due to atherosclerosis. Discussed need to repeat this especially if left foot numbness progressed and patient agreeable.   Numbness of left foot Intermittent primarily with playing tennis. Patient has a hard time describing but this has not worsened in year it has  been present (actually was present before last physical and mentioned to Dr. Cato MulliganSwords so >1 year). Discussed could be nerve issue or CV-consider repeat u/s vs. Referral to sports medicine. Patient opted to simply follow for now and will return to care if worsens. Otherwise check in 6 months from now.    Meds ordered this encounter  Medications  . amLODipine (NORVASC) 5 MG tablet    Sig: TAKE ONE-HALF (1/2) TABLET BY MOUTH DAILY - NEED OFFICE VISIT    Dispense:  45 tablet    Refill:  3

## 2014-11-11 ENCOUNTER — Other Ambulatory Visit (INDEPENDENT_AMBULATORY_CARE_PROVIDER_SITE_OTHER): Payer: BLUE CROSS/BLUE SHIELD

## 2014-11-11 DIAGNOSIS — Z0000 Encounter for general adult medical examination without abnormal findings: Secondary | ICD-10-CM | POA: Diagnosis not present

## 2014-11-11 LAB — COMPREHENSIVE METABOLIC PANEL
ALT: 16 U/L (ref 0–35)
AST: 17 U/L (ref 0–37)
Albumin: 4.1 g/dL (ref 3.5–5.2)
Alkaline Phosphatase: 81 U/L (ref 39–117)
BUN: 13 mg/dL (ref 6–23)
CALCIUM: 9.4 mg/dL (ref 8.4–10.5)
CHLORIDE: 103 meq/L (ref 96–112)
CO2: 32 mEq/L (ref 19–32)
CREATININE: 0.73 mg/dL (ref 0.40–1.20)
GFR: 87.53 mL/min (ref >60.00)
Glucose, Bld: 83 mg/dL (ref 70–99)
POTASSIUM: 4.7 meq/L (ref 3.5–5.1)
Sodium: 137 mEq/L (ref 135–145)
Total Bilirubin: 0.5 mg/dL (ref 0.2–1.2)
Total Protein: 6.6 g/dL (ref 6.0–8.3)

## 2014-11-11 LAB — LIPID PANEL
CHOLESTEROL: 200 mg/dL (ref 0–200)
HDL: 91.7 mg/dL (ref >39.00)
LDL Cholesterol: 98 mg/dL (ref 0–99)
NonHDL: 108.3
Total CHOL/HDL Ratio: 2
Triglycerides: 50 mg/dL (ref 0.0–149.0)
VLDL: 10 mg/dL (ref 0.0–40.0)

## 2014-11-11 LAB — CBC WITH DIFFERENTIAL/PLATELET
BASOS PCT: 0.8 % (ref 0.0–3.0)
Basophils Absolute: 0 10*3/uL (ref 0.0–0.1)
Eosinophils Absolute: 0.2 10*3/uL (ref 0.0–0.7)
Eosinophils Relative: 4 % (ref 0.0–5.0)
HCT: 40.1 % (ref 36.0–46.0)
Hemoglobin: 13.6 g/dL (ref 12.0–15.0)
Lymphocytes Relative: 25.7 % (ref 12.0–46.0)
Lymphs Abs: 1.1 10*3/uL (ref 0.7–4.0)
MCHC: 34 g/dL (ref 30.0–36.0)
MCV: 92.3 fl (ref 78.0–100.0)
MONO ABS: 0.3 10*3/uL (ref 0.1–1.0)
Monocytes Relative: 7.9 % (ref 3.0–12.0)
NEUTROS PCT: 61.6 % (ref 43.0–77.0)
Neutro Abs: 2.7 10*3/uL (ref 1.4–7.7)
Platelets: 239 10*3/uL (ref 150.0–400.0)
RBC: 4.34 Mil/uL (ref 3.87–5.11)
RDW: 12.9 % (ref 11.5–15.5)
WBC: 4.3 10*3/uL (ref 4.0–10.5)

## 2014-11-11 LAB — POCT URINALYSIS DIPSTICK
Bilirubin, UA: NEGATIVE
Blood, UA: NEGATIVE
GLUCOSE UA: NEGATIVE
KETONES UA: NEGATIVE
Nitrite, UA: NEGATIVE
PROTEIN UA: NEGATIVE
SPEC GRAV UA: 1.015
Urobilinogen, UA: 0.2
pH, UA: 8

## 2014-11-11 LAB — TSH: TSH: 1.8 u[IU]/mL (ref 0.35–4.50)

## 2014-11-18 ENCOUNTER — Encounter: Payer: Self-pay | Admitting: Family Medicine

## 2014-11-18 ENCOUNTER — Ambulatory Visit (INDEPENDENT_AMBULATORY_CARE_PROVIDER_SITE_OTHER): Payer: BLUE CROSS/BLUE SHIELD | Admitting: Family Medicine

## 2014-11-18 VITALS — BP 110/80 | HR 62 | Temp 98.2°F | Ht 64.0 in | Wt 166.0 lb

## 2014-11-18 DIAGNOSIS — Z0000 Encounter for general adult medical examination without abnormal findings: Secondary | ICD-10-CM

## 2014-11-18 MED ORDER — AMLODIPINE BESYLATE 5 MG PO TABS: ORAL_TABLET | ORAL | Status: DC

## 2014-11-18 NOTE — Patient Instructions (Addendum)
Follow up in 1 year for physical   Overall think you are doing great  Check in with me if any changes with the leg  Have your gyn send us a copy of your pap  2 year breast exam/mammogram cycle coming up this year.   Ok to change prenatal if you want folic acid.   BP looks great

## 2014-11-18 NOTE — Progress Notes (Signed)
Melissa Conch, MD Phone: [phone]  Subjective:  Patient presents today for their annual physical. Chief complaint-noted.   She is doing very well. No changes to left foot numbness with tennis. Patient has weight goal 155-160 and knows needs to increase exercise. Bp has been well controlled on amlodipine 2.5mg . Patient still does not want to get updated abdominal u/s since we could not see prior ultrasound/imaging. No progression, no weakness but does have some heaviness.   ROS- full  review of systems was completed and negative. Pertinent to BP- Denies any CP, HA, SOB, blurry vision, LE edema, transient weakness, orthopnea, PND. In regards to urine, no cva tenderness, polyuria, dysuria, fever.   The following were reviewed and entered/updated in epic: Past Medical History  Diagnosis Date  . Arthritis     cervical (neck)  . Hypertension    Patient Active Problem List   Diagnosis Date Noted  . Abdominal bruit 05/19/2014  . Numbness of left foot 05/19/2014  . Hypertension 01/15/2013   Past Surgical History  Procedure Laterality Date  . Cesarean section    . Dilation and curettage of uterus  may 2011    fibroids and polyps    Family History  Problem Relation Age of Onset  . Diabetes Father 15    heavy drinker  . Lung cancer Father     smoker    Medications- reviewed and updated Current Outpatient Prescriptions  Medication Sig Dispense Refill  . amLODipine (NORVASC) 5 MG tablet TAKE ONE-HALF (1/2) TABLET BY MOUTH DAILY - NEED OFFICE VISIT 45 tablet 5  . aspirin 81 MG tablet Take 81 mg by mouth daily.      . multivitamin (THERAGRAN) per tablet Take 1 tablet by mouth daily.       Allergies-reviewed and updated No Known Allergies  History   Social History  . Marital Status: Married    Spouse Name: N/A  . Number of Children: N/A  . Years of Education: N/A   Occupational History  . accountant    Social History Main Topics  . Smoking status: Never Smoker   .  Smokeless tobacco: Not on file  . Alcohol Use: 0.6 oz/week    1 Glasses of wine per week  . Drug Use: No  . Sexual Activity: Not on file   Other Topics Concern  . None   Social History Narrative   Married 1985 (Rian). 2 daughters. 1 granddog.    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 Oropharynx normal Neck: no thyromegaly CV: RRR no murmurs rubs or gallops Lungs: CTAB no crackles, wheeze, rhonchi Abdomen: soft/nontender/nondistended/normal bowel sounds. No rebound or guarding.  GU and breast defers to ob/gyn.  Ext: no edema Skin: warm, dry Neuro: grossly normal, moves all extremities, PERRLA  Assessment/Plan:  59 y.o. female presenting for annual physical.  Health Maintenance counseling: 1. Anticipatory guidance: Patient counseled regarding regular dental exams q 6 months, wearing seatbelts, wearing sunscreen even with tennis. Does not see dermatology.  2. Risk factor reduction:  Advised patient of need for regular exercise (patient  knows she needs to work on this-goal to walk more and continue tennis 3x a week) and diet rich and fruits and vegetables to reduce risk of heart attack and stroke.  3. Immunizations/screenings/ancillary studies Health Maintenance Due  Topic Date Due  . HIV Screening - gave blood within 10 years 06/19/1973  . PAP SMEAR - planned through ob/gyn also yearly breast exam- changed to 2 years.  08/01/2013   1 year CPE. Weight goal 155-160 at follow up.    Meds ordered this encounter  . amLODipine (NORVASC) 5 MG tablet    Sig: TAKE ONE-HALF (1/2) TABLET BY MOUTH DAILY    Dispense:  45 tablet    Refill:  5

## 2015-02-03 ENCOUNTER — Encounter: Payer: Self-pay | Admitting: Internal Medicine

## 2016-01-15 LAB — HM MAMMOGRAPHY

## 2016-01-19 ENCOUNTER — Encounter: Payer: Self-pay | Admitting: Family Medicine

## 2016-01-19 ENCOUNTER — Other Ambulatory Visit: Payer: Self-pay | Admitting: Family Medicine

## 2016-01-19 NOTE — Telephone Encounter (Signed)
Pt need new Rx for amlodipine    Pharm:  [facility]

## 2016-01-21 MED ORDER — AMLODIPINE BESYLATE 5 MG PO TABS: ORAL_TABLET | ORAL | Status: DC

## 2016-01-21 NOTE — Telephone Encounter (Signed)
Pt following up on med request. Pt has a cpe in October. Pt going out of town this weekend and needs asap.  [facility]

## 2016-04-26 ENCOUNTER — Other Ambulatory Visit (INDEPENDENT_AMBULATORY_CARE_PROVIDER_SITE_OTHER): Payer: BLUE CROSS/BLUE SHIELD

## 2016-04-26 DIAGNOSIS — Z0000 Encounter for general adult medical examination without abnormal findings: Secondary | ICD-10-CM | POA: Diagnosis not present

## 2016-04-26 LAB — CBC WITH DIFFERENTIAL/PLATELET
BASOS ABS: 0.1 10*3/uL (ref 0.0–0.1)
Basophils Relative: 1.3 % (ref 0.0–3.0)
EOS PCT: 4.6 % (ref 0.0–5.0)
Eosinophils Absolute: 0.2 10*3/uL (ref 0.0–0.7)
HEMATOCRIT: 39.8 % (ref 36.0–46.0)
Hemoglobin: 13.8 g/dL (ref 12.0–15.0)
LYMPHS ABS: 1.3 10*3/uL (ref 0.7–4.0)
LYMPHS PCT: 32.6 % (ref 12.0–46.0)
MCHC: 34.6 g/dL (ref 30.0–36.0)
MCV: 91.7 fl (ref 78.0–100.0)
MONOS PCT: 7.2 % (ref 3.0–12.0)
Monocytes Absolute: 0.3 10*3/uL (ref 0.1–1.0)
NEUTROS ABS: 2.2 10*3/uL (ref 1.4–7.7)
Neutrophils Relative %: 54.3 % (ref 43.0–77.0)
Platelets: 260 10*3/uL (ref 150.0–400.0)
RBC: 4.34 Mil/uL (ref 3.87–5.11)
RDW: 12.3 % (ref 11.5–15.5)
WBC: 4 10*3/uL (ref 4.0–10.5)

## 2016-04-26 LAB — BASIC METABOLIC PANEL
BUN: 11 mg/dL (ref 6–23)
CALCIUM: 8.8 mg/dL (ref 8.4–10.5)
CO2: 31 meq/L (ref 19–32)
Chloride: 98 mEq/L (ref 96–112)
Creatinine, Ser: 0.73 mg/dL (ref 0.40–1.20)
GFR: 87.07 mL/min (ref >60.00)
GLUCOSE: 88 mg/dL (ref 70–99)
POTASSIUM: 4 meq/L (ref 3.5–5.1)
SODIUM: 135 meq/L (ref 135–145)

## 2016-04-26 LAB — POC URINALSYSI DIPSTICK (AUTOMATED)
BILIRUBIN UA: NEGATIVE
Blood, UA: NEGATIVE
GLUCOSE UA: NEGATIVE
KETONES UA: NEGATIVE
LEUKOCYTES UA: NEGATIVE
NITRITE UA: NEGATIVE
Protein, UA: NEGATIVE
Spec Grav, UA: 1.01
Urobilinogen, UA: 0.2
pH, UA: 8.5

## 2016-04-26 LAB — LIPID PANEL
Cholesterol: 185 mg/dL (ref 0–200)
HDL: 82.4 mg/dL (ref >39.00)
LDL CALC: 91 mg/dL (ref 0–99)
NONHDL: 102.25
Total CHOL/HDL Ratio: 2
Triglycerides: 55 mg/dL (ref 0.0–149.0)
VLDL: 11 mg/dL (ref 0.0–40.0)

## 2016-04-26 LAB — TSH: TSH: 2.17 u[IU]/mL (ref 0.35–4.50)

## 2016-04-26 LAB — HEPATIC FUNCTION PANEL
ALBUMIN: 4.1 g/dL (ref 3.5–5.2)
ALK PHOS: 76 U/L (ref 39–117)
ALT: 13 U/L (ref 0–35)
AST: 16 U/L (ref 0–37)
Bilirubin, Direct: 0.2 mg/dL (ref 0.0–0.3)
TOTAL PROTEIN: 6.2 g/dL (ref 6.0–8.3)
Total Bilirubin: 0.7 mg/dL (ref 0.2–1.2)

## 2016-05-03 ENCOUNTER — Encounter: Payer: BLUE CROSS/BLUE SHIELD | Admitting: Family Medicine

## 2016-05-18 ENCOUNTER — Encounter: Payer: Self-pay | Admitting: Family Medicine

## 2016-05-18 ENCOUNTER — Ambulatory Visit (INDEPENDENT_AMBULATORY_CARE_PROVIDER_SITE_OTHER): Payer: BLUE CROSS/BLUE SHIELD | Admitting: Family Medicine

## 2016-05-18 VITALS — BP 122/89 | HR 65 | Temp 97.8°F | Ht 64.8 in | Wt 169.6 lb

## 2016-05-18 DIAGNOSIS — Z0000 Encounter for general adult medical examination without abnormal findings: Secondary | ICD-10-CM

## 2016-05-18 DIAGNOSIS — Z23 Encounter for immunization: Secondary | ICD-10-CM

## 2016-05-18 MED ORDER — AMLODIPINE BESYLATE 2.5 MG PO TABS: 2.5000 mg | ORAL_TABLET | Freq: Every day | ORAL | 3 refills | Status: DC

## 2016-05-18 NOTE — Progress Notes (Signed)
Pre visit review using our clinic review tool, if applicable. No additional management support is needed unless otherwise documented below in the visit note. 

## 2016-05-18 NOTE — Patient Instructions (Addendum)
Refilled amlodipine  Otherwise you are doing great- awesome job getting extra weight off- keep up good work. If we can get bottom BP number under 80 consider stopping amlodipine  Health Maintenance Due  Topic Date Due  . Hepatitis C Screening -Ask lab to add on hepatitis C next year 03-Jun-1956  . PAP SMEAR -Pleaseeeeee get your pap smear completed and send us a copy.  08/01/2013  . INFLUENZA VACCINE - today 03/01/2016

## 2016-05-18 NOTE — Progress Notes (Signed)
Phone: [phone]  Subjective:  Patient presents today for their annual physical. Chief complaint-noted.   See problem oriented charting- ROS- full  review of systems was completed and negative including No chest pain or shortness of breath. No headache or blurry vision.   The following were reviewed and entered/updated in epic: Past Medical History:  Diagnosis Date  . Arthritis    cervical (neck)  . Hypertension    Patient Active Problem List   Diagnosis Date Noted  . Hypertension 01/15/2013    Priority: Medium  . Abdominal bruit 05/19/2014    Priority: Low  . Numbness of left foot 05/19/2014    Priority: Low   Past Surgical History:  Procedure Laterality Date  . CESAREAN SECTION    . DILATION AND CURETTAGE OF UTERUS  may 2011   fibroids and polyps    Family History  Problem Relation Age of Onset  . Diabetes Father 4660    heavy drinker  . Lung cancer Father     smoker    Medications- reviewed and updated Current Outpatient Prescriptions  Medication Sig Dispense Refill  . aspirin 81 MG tablet Take 81 mg by mouth daily.      . calcium-vitamin D (OSCAL WITH D) 250-125 MG-UNIT tablet Take 1 tablet by mouth daily.    Marland Kitchen. amLODipine (NORVASC) 2.5 MG tablet Take 1 tablet (2.5 mg total) by mouth daily. 91 tablet 3   No current facility-administered medications for this visit.     Allergies-reviewed and updated No Known Allergies  Social History   Social History  . Marital status: Married    Spouse name: N/A  . Number of children: N/A  . Years of education: N/A   Occupational History  . accountant    Social History Main Topics  . Smoking status: Never Smoker  . Smokeless tobacco: None  . Alcohol use 0.6 oz/week    1 Glasses of wine per week  . Drug use: No  . Sexual activity: Not Asked   Other Topics Concern  . None   Social History Narrative   Married 1985 (Rian). 2 daughters. 1 granddog.    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 Oropharynx normal Neck: no thyromegaly CV: RRR no murmurs rubs or gallops Lungs: CTAB no crackles, wheeze, rhonchi Abdomen: soft/nontender/nondistended/normal bowel sounds. No rebound or guarding.  Ext: no edema Skin: warm, dry Neuro: grossly normal, moves all extremities, PERRLA  Assessment/Plan:  60 y.o. female presenting for annual physical.  Health Maintenance counseling: 1. Anticipatory guidance: Patient counseled regarding regular dental exams q6 months, eye exams, wearing seatbelts.  2. Risk factor reduction:  Advised patient of need for regular exercise and diet rich and fruits and vegetables to reduce risk of heart attack and stroke. Tennis 3x a week- goal last year was to increase walking. Added some walking. Had weight gain in last year but has lost 7 lbs again.  Wt Readings from Last 3 Encounters:  05/18/16 169 lb 9.6 oz (76.9 kg)  11/18/14 166 lb (75.3 kg)  05/19/14 164 lb (74.4 kg)  3. Immunizations/screenings/ancillary studies Immunization History  Administered Date(s) Administered  . Influenza Whole 05/02/2011  . Influenza,inj,Quad PF,36+ Mos 05/18/2016  . Influenza-Unspecified 05/15/2014  . Td 08/01/1997, 08/10/2010   Health Maintenance Due  Topic Date Due  . Hepatitis C Screening - next years labs 1955-09-16  . PAP SMEAR - agrees to schedule with GYN, declines here 08/01/2013  . INFLUENZA VACCINE - today  03/01/2016   4. Cervical cancer screening- see above 5. Breast cancer screening-  breast exam with GYN and mammogram 01/15/16 6. Colon cancer screening -  11/24/08 with 10 year follo wup 7. Skin cancer screening- skin exam completed today without obvious cancerous or precancerous lesions. Watch spot on left hand- looks like seborrheic keratosis that has peeled off  Status of chronic or acute concerns  HTN- controlled on amlodipine 2.5 mg. Change to 2.5mg  pill.   Left foot numb with tennis- no major issue  1 year for physical  Meds ordered this encounter  Medications  . calcium-vitamin D (OSCAL WITH D) 250-125 MG-UNIT tablet    Sig: Take 1 tablet by mouth daily.  Marland Kitchen amLODipine (NORVASC) 2.5 MG tablet    Sig: Take 1 tablet (2.5 mg total) by mouth daily.    Dispense:  91 tablet    Refill:  3   Return precautions advised.   Tana Conch, MD

## 2016-09-12 ENCOUNTER — Encounter: Payer: Self-pay | Admitting: Family Medicine

## 2016-09-12 LAB — HM PAP SMEAR: HM Pap smear: NEGATIVE

## 2017-06-01 ENCOUNTER — Telehealth: Payer: Self-pay | Admitting: Family Medicine

## 2017-06-01 NOTE — Telephone Encounter (Signed)
MEDICATION: amlodipine 2.5mg  tab   [facility]Y:  [facility] [address]  IS THIS A 90 DAY SUPPLY : yes  IS PATIENT OUT OF MEDICATION: no  IF NOT; HOW MUCH IS LEFT: 10 days left  LAST APPOINTMENT DATE: @10 /18/2017  NEXT APPOINTMENT DATE:@12 /07/2017  OTHER COMMENTS:    **Let patient know to contact pharmacy at the end of the day to make sure medication is ready. **  ** Please notify patient to allow 48-72 hours to process**  **Encourage patient to contact the pharmacy for refills or they can request refills through [facility]**

## 2017-06-02 ENCOUNTER — Other Ambulatory Visit: Payer: Self-pay

## 2017-06-02 MED ORDER — AMLODIPINE BESYLATE 2.5 MG PO TABS: 2.5000 mg | ORAL_TABLET | Freq: Every day | ORAL | 3 refills | Status: DC

## 2017-06-02 NOTE — Telephone Encounter (Signed)
Prescription sent to pharmacy as requested.

## 2017-07-12 ENCOUNTER — Ambulatory Visit (INDEPENDENT_AMBULATORY_CARE_PROVIDER_SITE_OTHER): Payer: BLUE CROSS/BLUE SHIELD | Admitting: Family Medicine

## 2017-07-12 ENCOUNTER — Encounter: Payer: Self-pay | Admitting: Family Medicine

## 2017-07-12 ENCOUNTER — Other Ambulatory Visit (HOSPITAL_COMMUNITY)
Admission: RE | Admit: 2017-07-12 | Discharge: 2017-07-12 | Disposition: A | Discharge status: Home / Self Care | Payer: BLUE CROSS/BLUE SHIELD | Source: Ambulatory Visit | Attending: Family Medicine | Admitting: Family Medicine

## 2017-07-12 VITALS — BP 110/82 | HR 59 | Temp 97.1°F | Ht 63.5 in | Wt 157.6 lb

## 2017-07-12 DIAGNOSIS — I10 Essential (primary) hypertension: Secondary | ICD-10-CM | POA: Diagnosis not present

## 2017-07-12 DIAGNOSIS — Z1159 Encounter for screening for other viral diseases: Secondary | ICD-10-CM

## 2017-07-12 DIAGNOSIS — M79672 Pain in left foot: Secondary | ICD-10-CM | POA: Diagnosis not present

## 2017-07-12 DIAGNOSIS — Z7251 High risk heterosexual behavior: Secondary | ICD-10-CM

## 2017-07-12 DIAGNOSIS — Z0001 Encounter for general adult medical examination with abnormal findings: Secondary | ICD-10-CM | POA: Diagnosis present

## 2017-07-12 LAB — COMPREHENSIVE METABOLIC PANEL
ALBUMIN: 4.7 g/dL (ref 3.5–5.2)
ALK PHOS: 74 U/L (ref 39–117)
ALT: 15 U/L (ref 0–35)
AST: 17 U/L (ref 0–37)
BILIRUBIN TOTAL: 0.6 mg/dL (ref 0.2–1.2)
BUN: 12 mg/dL (ref 6–23)
CO2: 30 mEq/L (ref 19–32)
Calcium: 9.8 mg/dL (ref 8.4–10.5)
Chloride: 102 mEq/L (ref 96–112)
Creatinine, Ser: 0.68 mg/dL (ref 0.40–1.20)
GFR: 94.11 mL/min (ref >60.00)
GLUCOSE: 89 mg/dL (ref 70–99)
Potassium: 3.8 mEq/L (ref 3.5–5.1)
Sodium: 139 mEq/L (ref 135–145)
TOTAL PROTEIN: 7.1 g/dL (ref 6.0–8.3)

## 2017-07-12 LAB — LIPID PANEL
CHOLESTEROL: 205 mg/dL — AB (ref 0–200)
HDL: 99.5 mg/dL (ref >39.00)
LDL Cholesterol: 94 mg/dL (ref 0–99)
NONHDL: 105.6
Total CHOL/HDL Ratio: 2
Triglycerides: 58 mg/dL (ref 0.0–149.0)
VLDL: 11.6 mg/dL (ref 0.0–40.0)

## 2017-07-12 LAB — CBC
HCT: 42.4 % (ref 36.0–46.0)
HEMOGLOBIN: 14.3 g/dL (ref 12.0–15.0)
MCHC: 33.8 g/dL (ref 30.0–36.0)
MCV: 94.3 fl (ref 78.0–100.0)
Platelets: 293 10*3/uL (ref 150.0–400.0)
RBC: 4.5 Mil/uL (ref 3.87–5.11)
RDW: 12.4 % (ref 11.5–15.5)
WBC: 5.1 10*3/uL (ref 4.0–10.5)

## 2017-07-12 NOTE — Patient Instructions (Addendum)
Sign release of information at the check out desk for mammogram and last pap smear at check out desk.   Shingrix - can get this at your pharmacy and let us know. If you cant find it- then hopefully we will have it by next year's physical.   See handout for plantar fasciitis. Use aleve twice a day for a week-start exercises after 3-4 days of aleve. Also consider gel cups for heels to take some pressure off the heel- wear comfortable shoes.   Once you finish aleve- Trial off amlodipine 2.5mg . Come back in 6 weeks for recheck of blood pressure with me.

## 2017-07-12 NOTE — Assessment & Plan Note (Signed)
controlled on amlodipine 2.5mg . Also on aspirin for primary prevention BP Readings from Last 3 Encounters:  07/12/17 110/82  05/18/16 122/89  11/18/14 110/80  She wants to trial off amlodipine as wants to eat more grapefruit. I asked her to return in 6 weeks for recheck

## 2017-07-12 NOTE — Progress Notes (Signed)
Phone: [phone]  Subjective:  Patient presents today for their annual physical. Chief complaint-noted.   See problem oriented charting- ROS- full  review of systems was completed and negative except for: left heel pain  The following were reviewed and entered/updated in epic: Past Medical History:  Diagnosis Date  . Arthritis    cervical (neck)  . Hypertension    Patient Active Problem List   Diagnosis Date Noted  . Hypertension 01/15/2013    Priority: Medium  . Abdominal bruit 05/19/2014    Priority: Low  . Numbness of left foot 05/19/2014    Priority: Low   Past Surgical History:  Procedure Laterality Date  . CESAREAN SECTION    . DILATION AND CURETTAGE OF UTERUS  may 2011   fibroids and polyps    Family History  Problem Relation Age of Onset  . Diabetes Father 3560       heavy drinker  . Lung cancer Father        smoker    Medications- reviewed and updated Current Outpatient Medications  Medication Sig Dispense Refill  . amLODipine (NORVASC) 2.5 MG tablet Take 1 tablet (2.5 mg total) by mouth daily. 90 tablet 3  . aspirin 81 MG tablet Take 81 mg by mouth daily.      . calcium-vitamin D (OSCAL WITH D) 250-125 MG-UNIT tablet Take 1 tablet by mouth daily.     No current facility-administered medications for this visit.     Allergies-reviewed and updated No Known Allergies  Social History   Socioeconomic History  . Marital status: Married    Spouse name: None  . Number of children: None  . Years of education: None  . Highest education level: None  Social Needs  . Financial resource strain: None  . Food insecurity - worry: None  . Food insecurity - inability: None  . Transportation needs - medical: None  . Transportation needs - non-medical: None  Occupational History  . Occupation: Airline pilotaccountant  Tobacco Use  . Smoking status: Never Smoker  . Smokeless tobacco: Never Used  Substance and Sexual Activity  . Alcohol use: Yes    Alcohol/week: 0.6  oz    Types: 1 Glasses of wine per week  . Drug use: No  . Sexual activity: None  Other Topics Concern  . None  Social History Narrative   Married 1985 (Rian). 2 daughters. 1 granddog.    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 Oropharynx normal Neck: no thyromegaly CV: RRR no murmurs rubs or gallops Lungs: CTAB no crackles, wheeze, rhonchi Abdomen: soft/nontender/nondistended/normal bowel sounds. No rebound or guarding.  Ext: no edema Skin: warm, dry Neuro: grossly normal, moves all extremities, PERRLA  Assessment/Plan:  61 y.o. female presenting for annual physical.  Health Maintenance counseling: 1. Anticipatory guidance: Patient counseled regarding regular dental exams -q6 months, eye exams -yearly, wearing seatbelts.  2. Risk factor reduction:  Advised patient of need for regular exercise and diet rich and  fruits and vegetables to reduce risk of heart attack and stroke. Exercise- tennis 2x a week- but had been more before heel issue- also doing 10 minute walk weather permitting. Diet-had period where she ate much better and has reached new set point. Has lost 12 lbs over last year!  Wt Readings from Last 3 Encounters:  07/12/17 157 lb 9.6 oz (71.5 kg)  05/18/16 169 lb 9.6 oz (76.9 kg)  11/18/14 166 lb (75.3 kg)  3. Immunizations/screenings/ancillary studies- will screen for HCV today. Discussed shingrix availability issues Immunization History  Administered Date(s) Administered  . Influenza Whole 05/02/2011  . Influenza,inj,Quad PF,6+ Mos 05/18/2016  .  Influenza-Unspecified 05/15/2014, 06/03/2017  . Td 08/01/1997, 08/10/2010  4. Cervical cancer screening- sign ROI today advised. Had in September 12, 2016. Had a follow up test that was normal. Had cryotherapy at age 61.  5. Breast cancer screening-  breast exam with GYN and mammogram on q2 year schedule per patient- 01/15/16 so will be due next year 6. Colon cancer screening - 11/24/2008 with 10 year repeat 7. Skin cancer screening- has not seen dermatology lately. advised regular sunscreen use. Denies worrisome, changing, or new skin lesions- prior area on left hand unchanged.  8. Osteoporosis screening at 4765- will plan on this at 67 9. Std screen- opts in with new partner  Status of chronic or acute concerns   Left heel pain- likely plantar fasciitis- trial home exercises, aleve for 1 week (stay on amlodipine for this). Relative rest- 2 weeks off tennis then once a week  Unprotected sex - with new boyfriend after separation- screening labs  Hypertension controlled on amlodipine 2.5mg . Also on aspirin for primary prevention BP Readings from Last 3 Encounters:  07/12/17 110/82  05/18/16 122/89  11/18/14 110/80  She wants to trial off amlodipine as wants to eat more grapefruit. I asked her to return in 6 weeks for recheck  6 weeks Orders Placed This Encounter  Procedures  . CBC    [place]  . Comprehensive metabolic panel    [facility]    Order Specific Question:   Has the patient fasted?    Answer:   No  . Lipid panel    [place]    Order Specific Question:   Has the patient fasted?    Answer:   No  . Hepatitis C antibody  . Hepatitis B surface antigen  . Hepatitis B surface antibody  . Hepatitis B core antibody, total  . HIV antibody    [facility]  . RPR    [facility]   Return precautions advised.  Tana ConchStephen Cressida Milford, MD

## 2017-07-12 NOTE — Addendum Note (Signed)
Addended by: Felix AhmadiFRANSEN, Kinsler Soeder A on: 07/12/2017 11:52 AM   Modules accepted: Orders

## 2017-07-13 ENCOUNTER — Encounter: Payer: Self-pay | Admitting: Family Medicine

## 2017-07-13 LAB — URINE CYTOLOGY ANCILLARY ONLY
CHLAMYDIA, DNA PROBE: NEGATIVE
Neisseria Gonorrhea: NEGATIVE
TRICH (WINDOWPATH): NEGATIVE

## 2017-07-13 LAB — RPR: RPR Ser Ql: NONREACTIVE

## 2017-07-13 LAB — HEPATITIS B CORE ANTIBODY, TOTAL: Hep B Core Total Ab: NONREACTIVE

## 2017-07-13 LAB — HEPATITIS C ANTIBODY
Hepatitis C Ab: NONREACTIVE
SIGNAL TO CUT-OFF: 0.01 (ref <1.00)

## 2017-07-13 LAB — HEPATITIS B SURFACE ANTIGEN: HEP B S AG: NONREACTIVE

## 2017-07-13 LAB — HIV ANTIBODY (ROUTINE TESTING W REFLEX): HIV: NONREACTIVE

## 2017-07-13 LAB — HEPATITIS B SURFACE ANTIBODY,QUALITATIVE: Hep B S Ab: NONREACTIVE

## 2017-09-06 ENCOUNTER — Ambulatory Visit (INDEPENDENT_AMBULATORY_CARE_PROVIDER_SITE_OTHER): Payer: BLUE CROSS/BLUE SHIELD | Admitting: Family Medicine

## 2017-09-06 ENCOUNTER — Encounter: Payer: Self-pay | Admitting: Family Medicine

## 2017-09-06 VITALS — BP 150/92 | HR 65 | Temp 98.2°F | Ht 63.5 in | Wt 161.0 lb

## 2017-09-06 DIAGNOSIS — I10 Essential (primary) hypertension: Secondary | ICD-10-CM

## 2017-09-06 NOTE — Patient Instructions (Signed)
Your blood pressure is above goal today but you have had some serious stressors plus some set backs on your weight journey from the holidays. I would like for you to buy/use a home cuff to check at least 5x a week. Your goal is ,140/90. If you note in the next few weeks that it is much higher than our goal, see us sooner. Otherwise, see me in 8-10 weeks. Bring your home cuff and your log of blood pressures with you to visit.   For now may remain off amlodipine and enjoy some grapefruit
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
## 2018-09-25 LAB — HM MAMMOGRAPHY

## 2018-10-03 ENCOUNTER — Encounter: Payer: Self-pay | Admitting: Family Medicine

## 2018-10-15 ENCOUNTER — Encounter: Payer: Self-pay | Admitting: Family Medicine

## 2018-11-06 ENCOUNTER — Telehealth: Payer: Self-pay | Admitting: Family Medicine
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
## 2018-11-06 NOTE — Telephone Encounter (Signed)
Noted  

## 2018-11-06 NOTE — Telephone Encounter (Signed)
Patient is scheduled for 11/07/18 @ 840am

## 2018-11-07 ENCOUNTER — Ambulatory Visit (INDEPENDENT_AMBULATORY_CARE_PROVIDER_SITE_OTHER): Payer: BLUE CROSS/BLUE SHIELD | Admitting: Family Medicine

## 2018-11-07 ENCOUNTER — Telehealth: Payer: Self-pay | Admitting: Family Medicine

## 2018-11-07 ENCOUNTER — Encounter: Payer: Self-pay | Admitting: Family Medicine

## 2018-11-07 VITALS — BP 132/86 | Temp 99.4°F | Wt 159.3 lb

## 2018-11-07 DIAGNOSIS — J329 Chronic sinusitis, unspecified: Secondary | ICD-10-CM

## 2018-11-07 DIAGNOSIS — B9689 Other specified bacterial agents as the cause of diseases classified elsewhere: Secondary | ICD-10-CM

## 2018-11-07 DIAGNOSIS — I10 Essential (primary) hypertension: Secondary | ICD-10-CM | POA: Diagnosis not present

## 2018-11-07 DIAGNOSIS — M7918 Myalgia, other site: Secondary | ICD-10-CM

## 2018-11-07 MED ORDER — AMOXICILLIN-POT CLAVULANATE 875-125 MG PO TABS: 1.0000 | ORAL_TABLET | Freq: Two times a day (BID) | ORAL | 0 refills | Status: AC
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
## 2018-11-07 NOTE — Progress Notes (Signed)
That's ok.

## 2018-11-07 NOTE — Telephone Encounter (Signed)
Please call patient and direct them to the billing department.

## 2018-11-07 NOTE — Patient Instructions (Addendum)
Video visit
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
## 2018-11-07 NOTE — Progress Notes (Signed)
Hi Dr. Durene Cal,  It looks like this was a telehealth visit.  Also, I did not see any mention of an ekg in note unless I am overlooking it.    I do have ekg workflow instructions that I can email if you need them.  Thanks, Marcelino Duster

## 2018-11-07 NOTE — Assessment & Plan Note (Signed)
S: controlled on no medication on home monitoring.  She has remained off amlodipine 2.5 mg since her visit last year BP Readings from Last 3 Encounters:  11/07/18 132/86  09/06/17 (!) 150/92- in office but had high stress level at time so we opted for monitoring as home #s were ok  07/12/17 110/82  A/P:  Stable. Continue current medications.

## 2018-11-07 NOTE — Telephone Encounter (Signed)
Lea- just wanted to forward this to you. I do not know how to submit bill any differently for insurance claim.

## 2018-12-21 ENCOUNTER — Encounter: Payer: Self-pay | Admitting: Internal Medicine

## 2019-01-03 ENCOUNTER — Ambulatory Visit: Payer: Self-pay | Admitting: Family Medicine

## 2019-01-03 NOTE — Telephone Encounter (Signed)
Pt. Reports a co-worker has tested positive for COVID 19, and she would like to be tested and also is interested in antibody testing. Scheduling line busy. Please advise pt. She does not have symptoms.Contact number [phone].  Answer Assessment - Initial Assessment Questions 1. CLOSE CONTACT: "Who is the person with the confirmed or suspected COVID-19 infection that you were exposed to?"     CO-worker 2. PLACE of CONTACT: "Where were you when you were exposed to COVID-19?" (e.g., home, school, medical waiting room; which city?)     Work 3. TYPE of CONTACT: "How much contact was there?" (e.g., sitting next to, live in same house, work in same office, same building)     Same building - same restroom 4. DURATION of CONTACT: "How long were you in contact with the COVID-19 patient?" (e.g., a few seconds, passed by person, a few minutes, live with the patient)     Minutes 5. DATE of CONTACT: "When did you have contact with a COVID-19 patient?" (e.g., how many days ago)     Past Friday 6. TRAVEL: "Have you traveled out of the country recently?" If so, "When and where?"     * Also ask about out-of-state travel, since the CDC has identified some high-risk cities for community spread in the [place].     * Note: Travel becomes less relevant if there is widespread community transmission where the patient lives.     No 7. COMMUNITY SPREAD: "Are there lots of cases of COVID-19 (community spread) where you live?" (See public health department website, if unsure)       Yes 8. SYMPTOMS: "Do you have any symptoms?" (e.g., fever, cough, breathing difficulty)     No 9. PREGNANCY OR POSTPARTUM: "Is there any chance you are pregnant?" "When was your last menstrual period?" "Did you deliver in the last 2 weeks?"     No 10. HIGH RISK: "Do you have any heart or lung problems? Do you have a weak immune system?" (e.g., CHF, COPD, asthma, HIV positive, chemotherapy, renal failure, diabetes mellitus, sickle cell anemia)      No  Protocols used: CORONAVIRUS (COVID-19) EXPOSURE-A-AH

## 2019-01-03 NOTE — Telephone Encounter (Signed)
Patient is scheduled for 01/04/19 @ 8:40am

## 2019-01-03 NOTE — Telephone Encounter (Signed)
Please call and schedule appointment.

## 2019-01-04 ENCOUNTER — Ambulatory Visit (INDEPENDENT_AMBULATORY_CARE_PROVIDER_SITE_OTHER): Payer: BC Managed Care – PPO | Admitting: Family Medicine

## 2019-01-04 ENCOUNTER — Other Ambulatory Visit: Payer: BC Managed Care – PPO

## 2019-01-04 ENCOUNTER — Telehealth: Payer: Self-pay

## 2019-01-04 ENCOUNTER — Encounter: Payer: Self-pay | Admitting: Family Medicine

## 2019-01-04 ENCOUNTER — Telehealth: Payer: Self-pay | Admitting: Family Medicine

## 2019-01-04 VITALS — BP 139/87 | HR 65 | Temp 97.6°F | Ht 63.5 in | Wt 162.0 lb

## 2019-01-04 DIAGNOSIS — R0981 Nasal congestion: Secondary | ICD-10-CM

## 2019-01-04 DIAGNOSIS — R430 Anosmia: Secondary | ICD-10-CM | POA: Diagnosis not present

## 2019-01-04 DIAGNOSIS — Z20822 Contact with and (suspected) exposure to covid-19: Secondary | ICD-10-CM

## 2019-01-04 NOTE — Telephone Encounter (Signed)
Dr. Durene Cal request COVID 19  test.

## 2019-01-04 NOTE — Telephone Encounter (Signed)
Patient: Melissa [facility] MRN: [id number] DOB: 07/08/1956  Reason for test: anosmia as well as positive covid 19 test in coworker  Insurance information  Primary Visit Coverage   Payer Plan Sponsor Code Group Number Group Name  Tarry Kos [place]  [id number]   Primary Visit Coverage Subscriber   ID Name SSN Address  [id number] [name], [name] [id number], [address]     [place], [place] [address]

## 2019-01-04 NOTE — Patient Instructions (Addendum)
Health Maintenance Due  Topic Date Due  . COLONOSCOPY Pt stated that she received a reminder and she will set it up 11/25/2018

## 2019-01-04 NOTE — Progress Notes (Signed)
Phone [phone]   Subjective:  Virtual visit via Video note. Chief complaint: Chief Complaint  Patient presents with  . Sinus Problem    Also to discuss Covid-19 testing    This visit type was conducted due to national recommendations for restrictions regarding the COVID-19 Pandemic (e.g. social distancing).  This format is felt to be most appropriate for this patient at this time balancing risks to patient and risks to population by having him in for in person visit.  No physical exam was performed (except for noted visual exam or audio findings with Telehealth visits).    Our team/I connected with [name] at  8:40 AM EDT by a video enabled telemedicine application (doxy.me or caregility through epic) and verified that I am speaking with the correct person using two identifiers.  Location patient: [place] Location provider: [facility], office Persons participating in the virtual visit:  patient  Our team/I discussed the limitations of evaluation and management by telemedicine and the availability of in person appointments. In light of current covid-19 pandemic, patient also understands that we are trying to protect them by minimizing in office contact if at all possible.  The patient expressed consent for telemedicine visit and agreed to proceed. Patient understands insurance will be billed.   ROS- No fever, chills, cough, shortness of breath, body aches, sore throat. Does have loss of taste and smell   Past Medical History-  Patient Active Problem List   Diagnosis Date Noted  . Hypertension 01/15/2013    Priority: Medium  . Abdominal bruit 05/19/2014    Priority: Low  . Numbness of left foot 05/19/2014    Priority: Low    Medications- reviewed and updated Current Outpatient Medications  Medication Sig Dispense Refill  . calcium-vitamin D (OSCAL WITH D) 250-125 MG-UNIT tablet Take 1 tablet by mouth daily.    . Cobalamin Combinations (B-12) [phone] MCG SUBL  B12    . Influenza vac split quadrivalent PF (FLULAVAL QUADRIVALENT) 0.5 ML injection Flulaval Quad 2019-2020 (PF) 60 mcg (15 mcg x 4)/0.5 mL IM syringe     No current facility-administered medications for this visit.      Objective:  BP 139/87   Pulse 65   Temp 97.6 F (36.4 C) (Tympanic)   Ht 5' 3.5" (1.613 m)   Wt 162 lb (73.5 kg)   BMI 28.25 kg/m  self reported vitals Gen: NAD, resting comfortably Lungs: nonlabored, normal respiratory rate  Skin: appears dry, no obvious rash    Assessment and Plan   # Anosmia/sinus congestion S: Coworker tested positive for covid 19- last at work a week ago and tested Monday and results came back Wednesday night and informed coworker yesterday. Works in same office- has not had prolonged face to face contact- patient is only one in her office that wears a mask and several interactions with people that are prolonged that had prolonged contact with positive coworker.   Patient also concerned about ongoign sinus and anosmia issues. Sinusitis diagnosed 2 months ago and treated and though sinuses are better, not fully better. 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 She reflects back and daughter in march became very ill (son in law worked at [employer]). Sinus symptoms largely cleared but much better- can breath through nose.   A/P: will test for covid 19 to be on safe side with ongoing anosmia and also could be asymptomatic considering recent potential covid 19 contacts. We discussed ent refer if  negative  As she is concerned about cause of ongoing anosmia.  - she asked about antibody testing given prior sinusitis symptoms but we opted to test for active disease instead which would allow ENT refer.   Lab/Order associations: Anosmia  Sinus congestion  Return precautions advised.  Tana ConchStephen , MD

## 2019-01-07 ENCOUNTER — Encounter: Payer: Self-pay | Admitting: Family Medicine

## 2019-01-07 DIAGNOSIS — R430 Anosmia: Secondary | ICD-10-CM

## 2019-01-07 LAB — NOVEL CORONAVIRUS, NAA: SARS-CoV-2, NAA: NOT DETECTED

## 2019-01-07 NOTE — Telephone Encounter (Deleted)
Pt just had a

## 2019-01-08 ENCOUNTER — Other Ambulatory Visit (INDEPENDENT_AMBULATORY_CARE_PROVIDER_SITE_OTHER): Payer: BC Managed Care – PPO

## 2019-01-08 ENCOUNTER — Other Ambulatory Visit: Payer: Self-pay

## 2019-01-08 DIAGNOSIS — R430 Anosmia: Secondary | ICD-10-CM

## 2019-01-09 LAB — SARS-COV-2 ANTIBODY, IGM: SARS-CoV-2 Antibody, IgM: NEGATIVE

## 2019-01-11 ENCOUNTER — Encounter: Payer: Self-pay | Admitting: Family Medicine

## 2019-01-11 DIAGNOSIS — R430 Anosmia: Secondary | ICD-10-CM

## 2019-10-04 ENCOUNTER — Ambulatory Visit: Payer: BC Managed Care – PPO | Attending: Internal Medicine

## 2019-10-04 DIAGNOSIS — Z23 Encounter for immunization: Secondary | ICD-10-CM | POA: Insufficient documentation

## 2019-10-04 NOTE — Progress Notes (Signed)
   Covid-19 Vaccination Clinic  Name:  Melissa [facility]    MRN: [id number] DOB: 1955-11-13  10/04/2019  [name] was observed post Covid-19 immunization for 15 minutes without incident. She was provided with Vaccine Information Sheet and instruction to access the V-Safe system.   [name] was instructed to call 911 with any severe reactions post vaccine: Marland Kitchen Difficulty breathing  . Swelling of face and throat  . A fast heartbeat  . A bad rash all over body  . Dizziness and weakness   Immunizations Administered    Name Date Dose VIS Date Route   Pfizer COVID-19 Vaccine 10/04/2019 12:49 PM 0.3 mL 07/12/2019 Intramuscular   Manufacturer: [employer], [employer]   Lot: [id number]   NDC: [address]-3

## 2019-11-05 ENCOUNTER — Ambulatory Visit: Payer: BC Managed Care – PPO | Attending: Internal Medicine

## 2019-11-05 DIAGNOSIS — Z23 Encounter for immunization: Secondary | ICD-10-CM

## 2019-11-05 NOTE — Progress Notes (Signed)
   Covid-19 Vaccination Clinic  Name:  Melissa Dawson    MRN: [id number] DOB: 07/18/1956  11/05/2019  [name] was observed post Covid-19 immunization for 15 minutes without incident. She was provided with Vaccine Information Sheet and instruction to access the V-Safe system.   [name] was instructed to call 911 with any severe reactions post vaccine: Marland Kitchen Difficulty breathing  . Swelling of face and throat  . A fast heartbeat  . A bad rash all over body  . Dizziness and weakness   Immunizations Administered    Name Date Dose VIS Date Route   Pfizer COVID-19 Vaccine 11/05/2019  1:02 PM 0.3 mL 07/12/2019 Intramuscular   Manufacturer: [employer], [employer]   Lot: [id number]   NDC: [address]-0

## 2019-12-17 ENCOUNTER — Encounter: Payer: Self-pay | Admitting: Family Medicine

## 2019-12-17 ENCOUNTER — Ambulatory Visit (INDEPENDENT_AMBULATORY_CARE_PROVIDER_SITE_OTHER): Payer: BC Managed Care – PPO | Admitting: Family Medicine

## 2019-12-17 ENCOUNTER — Other Ambulatory Visit: Payer: Self-pay

## 2019-12-17 VITALS — BP 122/78 | HR 78 | Temp 98.0°F | Ht 63.5 in | Wt 168.0 lb

## 2019-12-17 DIAGNOSIS — Z1211 Encounter for screening for malignant neoplasm of colon: Secondary | ICD-10-CM | POA: Diagnosis not present

## 2019-12-17 DIAGNOSIS — I10 Essential (primary) hypertension: Secondary | ICD-10-CM

## 2019-12-17 DIAGNOSIS — Z0000 Encounter for general adult medical examination without abnormal findings: Secondary | ICD-10-CM | POA: Diagnosis not present

## 2019-12-17 DIAGNOSIS — R430 Anosmia: Secondary | ICD-10-CM

## 2019-12-17 DIAGNOSIS — Z1212 Encounter for screening for malignant neoplasm of rectum: Secondary | ICD-10-CM

## 2019-12-17 LAB — COMPREHENSIVE METABOLIC PANEL
ALT: 17 U/L (ref 0–35)
AST: 21 U/L (ref 0–37)
Albumin: 4.9 g/dL (ref 3.5–5.2)
Alkaline Phosphatase: 81 U/L (ref 39–117)
BUN: 14 mg/dL (ref 6–23)
CO2: 30 mEq/L (ref 19–32)
Calcium: 9.4 mg/dL (ref 8.4–10.5)
Chloride: 100 mEq/L (ref 96–112)
Creatinine, Ser: 0.72 mg/dL (ref 0.40–1.20)
GFR: 82.21 mL/min (ref >60.00)
Glucose, Bld: 94 mg/dL (ref 70–99)
Potassium: 4.6 mEq/L (ref 3.5–5.1)
Sodium: 137 mEq/L (ref 135–145)
Total Bilirubin: 0.8 mg/dL (ref 0.2–1.2)
Total Protein: 6.9 g/dL (ref 6.0–8.3)

## 2019-12-17 LAB — LIPID PANEL
Cholesterol: 232 mg/dL — ABNORMAL HIGH (ref 0–200)
HDL: 99 mg/dL (ref >39.00)
LDL Cholesterol: 120 mg/dL — ABNORMAL HIGH (ref 0–99)
NonHDL: 133.05
Total CHOL/HDL Ratio: 2
Triglycerides: 65 mg/dL (ref 0.0–149.0)
VLDL: 13 mg/dL (ref 0.0–40.0)

## 2019-12-17 LAB — CBC WITH DIFFERENTIAL/PLATELET
Basophils Absolute: 0.1 10*3/uL (ref 0.0–0.1)
Basophils Relative: 1.8 % (ref 0.0–3.0)
Eosinophils Absolute: 0.1 10*3/uL (ref 0.0–0.7)
Eosinophils Relative: 3.1 % (ref 0.0–5.0)
HCT: 42.4 % (ref 36.0–46.0)
Hemoglobin: 14.9 g/dL (ref 12.0–15.0)
Lymphocytes Relative: 33.1 % (ref 12.0–46.0)
Lymphs Abs: 1.6 10*3/uL (ref 0.7–4.0)
MCHC: 35 g/dL (ref 30.0–36.0)
MCV: 93.5 fl (ref 78.0–100.0)
Monocytes Absolute: 0.3 10*3/uL (ref 0.1–1.0)
Monocytes Relative: 6.5 % (ref 3.0–12.0)
Neutro Abs: 2.7 10*3/uL (ref 1.4–7.7)
Neutrophils Relative %: 55.5 % (ref 43.0–77.0)
Platelets: 263 10*3/uL (ref 150.0–400.0)
RBC: 4.53 Mil/uL (ref 3.87–5.11)
RDW: 12.5 % (ref 11.5–15.5)
WBC: 4.9 10*3/uL (ref 4.0–10.5)

## 2019-12-17 LAB — VITAMIN B12: Vitamin B-12: 756 pg/mL (ref 211–911)

## 2019-12-17 LAB — TSH: TSH: 1.86 u[IU]/mL (ref 0.35–4.50)

## 2019-12-17 NOTE — Patient Instructions (Addendum)
Health Maintenance Due  Topic Date Due  . COLONOSCOPY - we ordered cologuard for you - please let us know if you dont receive this within 4 weeks 11/25/2018  . PAP SMEAR- will send for notes  09/13/2019   Either schedule shingrix #1 here or at your pharmacy. You will need a repeat injection withiin 2-6 months when you decide to do this.   We will call you within two weeks about your referral to neurology due to anosmia. If you do not hear within 3 weeks, give [place] a call.   Please stop by lab before you go If you have mychart- we will send your results within 3 business days of [place] receiving them.  If you do not have mychart- we will call you about results within 5 business days of [place] receiving them.   Recommended follow up: Return in about 1 year (around 12/16/2020) for physical or sooner if needed . as long as blood pressure <138/88 on average and no other new/worsening symptoms

## 2019-12-17 NOTE — Progress Notes (Deleted)
Duplicate note created by CMA

## 2019-12-17 NOTE — Progress Notes (Signed)
Phone: [phone]   Subjective:  Patient presents today for their annual physical. Chief complaint-noted.   See problem oriented charting- ROS- full  review of systems was completed and negative except for: anosmia  The following were reviewed and entered/updated in epic: Past Medical History:  Diagnosis Date  . Arthritis    cervical (neck)  . Hypertension    Patient Active Problem List   Diagnosis Date Noted  . Hypertension 01/15/2013    Priority: Medium  . Abdominal bruit 05/19/2014    Priority: Low  . Numbness of left foot 05/19/2014    Priority: Low   Past Surgical History:  Procedure Laterality Date  . CESAREAN SECTION    . DILATION AND CURETTAGE OF UTERUS  may 2011   fibroids and polyps    Family History  Problem Relation Age of Onset  . Diabetes Father 75       heavy drinker  . Lung cancer Father        smoker    Medications- reviewed and updated Current Outpatient Medications  Medication Sig Dispense Refill  . calcium-vitamin D (OSCAL WITH D) 250-125 MG-UNIT tablet Take 1 tablet by mouth daily.    . Cobalamin Combinations (B-12) [phone] MCG SUBL B12     No current facility-administered medications for this visit.    Allergies-reviewed and updated Allergies  Allergen Reactions  . Amoxicillin Rash    Pt reported rash on back     Social History   Social History Narrative   Separated mid 2017- Married 1985 (Rian). 2 daughters. 1 granddog.    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 Oropharynx normal Neck: no thyromegaly CV: RRR no murmurs  rubs or gallops Lungs: CTAB no crackles, wheeze, rhonchi Abdomen: soft/nontender/nondistended/normal bowel sounds. No rebound or guarding.  Ext: no edema Skin: warm, dry Neuro: grossly normal, moves all extremities, PERRLA   Assessment and Plan   64 y.o. female presenting for annual physical.  Health Maintenance counseling: 1. Anticipatory guidance: Patient counseled regarding regular dental exams -q6 months, eye exams - yearly,  avoiding smoking and second hand smoke , limiting alcohol to 1 beverage per day .   2. Risk factor reduction:  Advised patient of need for regular exercise and diet rich and fruits and vegetables to reduce risk of heart attack and stroke. Exercise- still playing tennis twice a week and riding bike once a week. Diet- weight up slightly from last year about 6 lbs- discussed reversing this trend - feels eating reasonably but more sedentary with work- she is tweaking the diet to try to adjust. She is also considering adding in a midday walk.  Wt Readings from Last 3 Encounters:  12/17/19 168 lb (76.2 kg)  01/04/19 162 lb (73.5 kg)  11/07/18 159 lb 4.8 oz (72.3 kg)  3. Immunizations/screenings/ancillary studies- zostavax previously discussed shingrix and opts in- otherwise up to date.  Immunization History  Administered Date(s) Administered  . Influenza Whole 05/02/2011  . Influenza,inj,Quad PF,6+ Mos  05/18/2016, 07/07/2018  . Influenza-Unspecified 05/15/2014, 06/03/2017, 07/07/2018  . PFIZER SARS-COV-2 Vaccination 10/04/2019, 11/05/2019  . Td 08/01/1997, 08/10/2010  4. Cervical cancer screening-  has appointment for next month with GYN. We are going to get a copy of most recent exam through [facility]- had in 2020. Cryotherapy age 11.  89. Breast cancer screening-  breast exam  With GYN and mammogram - 09/25/2018 and she is postponing slightly due to recent covid shot 6. Colon cancer screening - no family history or polyp history. Last colonosocpy 11/24/2008. Prefers to do  cologuard- ordered today 7. Skin cancer screening- saw dermatology on Friday. advised regular sunscreen use. Denies worrisome, changing, or new skin lesions.  8. Birth control/STD check-  Not dating currently- has some dinners with ex husband who is still alcoholic. Declines STD testing and postmenopausal.  9. Osteoporosis screening at 79- will plan to screen at age 41 at latest- she prefers to wait until 42 -never smoker  Status of chronic or acute concerns   #social update- has been working a lot of hours as Engineer, maintenance (IT) and also in HR- up to 80 hours a week and a lot of stress. Doesn't feel she could transition to another job with same salary so feels a bit stuck.  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 No changes in her memory. No history of trauma. Had CT of sinuses nonrevealing- saw ENT who thought could have been another virus- Dr. Constance Holster. No known environmental exposures. Wants to be proactive. No tremor. Has not noted change in way she walks though feels like left leg feels slightly numb/draggy - will check TSH and b12. If these are normal could get neurology consult particularly with her primary concern being Alzheimer's or Parkinson's   #hypertension S: medication:  None, previously on amlodipine 2.5mg - she had wanted to come off to see if she could eat more grapefruit Home readings #s:  126/81 on sunday A/P: excellent control- continue current meds  Recommended follow up: Return in about 1 year (around 12/16/2020) for physical or sooner if needed .  Lab/Order associations: fasting (last ate 11 pm last night)   ICD-10-CM   1. Preventative health care  Z00.00 TSH  2. Essential hypertension  I10   3. Screening for colorectal cancer  Z12.11 Cologuard   Z12.12   4. Anosmia  R43.0 Vitamin B12    TSH    Ambulatory referral to Neurology    No orders of the defined types were placed in this encounter.   Return precautions advised.  Garret Reddish,  MD

## 2020-03-06 NOTE — Patient Instructions (Addendum)
Health Maintenance Due  Topic Date Due  . COLONOSCOPY -team she did not get this- please reach out to cologuard 11/25/2018  . PAP SMEAR-due next year 09/13/2019  . INFLUENZA VACCINE - update [place] when you have gotten 03/01/2020   Possibly related to 5-FU treatment  I am interested to see how you do if you do use another round  For now lets monitor -can consider testing for sjogrens if persistent outside of 5-FU treatment - try humidifier at night -consider strips for nose to make sure breathing through nose at night - consider sugar- free lozenges/candy to stimulate salivery glands -if new or worsening symptoms let me know. Or if symptoms lets say persist 3 months out from 5 FU treatment without improvement  -may need ENT opinion

## 2020-03-06 NOTE — Progress Notes (Signed)
Phone [phone] In person visit   Subjective:   Melissa Dawson is a 64 y.o. year old very pleasant female patient who presents for/with See problem oriented charting Chief Complaint  Patient presents with  . Dry Mouth    This visit occurred during the SARS-CoV-2 public health emergency.  Safety protocols were in place, including screening questions prior to the visit, additional usage of staff PPE, and extensive cleaning of exam room while observing appropriate contact time as indicated for disinfecting solutions.   Past Medical History-  Patient Active Problem List   Diagnosis Date Noted  . Hypertension 01/15/2013    Priority: Medium  . Abdominal bruit 05/19/2014    Priority: Low  . Numbness of left foot 05/19/2014    Priority: Low    Medications- reviewed and updated Current Outpatient Medications  Medication Sig Dispense Refill  . calcium-vitamin D (OSCAL WITH D) 250-125 MG-UNIT tablet Take 1 tablet by mouth daily.    . Cobalamin Combinations (B-12) [phone] MCG SUBL B12     No current facility-administered medications for this visit.     Objective:  BP 122/84   Pulse 61   Temp 97.6 F (36.4 C)   Ht 5' 3.5" (1.613 m)   Wt 170 lb 6.4 oz (77.3 kg)   SpO2 100%   BMI 29.71 kg/m  Gen: NAD, resting comfortably No obvious dry eyes Mucus membranes moist- no obvious rash on lower lip. Patient states feels dryer than her regular baseline though.  CV: RRR no murmurs rubs or gallops Lungs: CTAB no crackles, wheeze, rhonchi    Assessment and Plan   #Dry Mouth/possible rash in mouth S: pt c/o dry mouth that started around June, almost feeling like she had a rash on the inside of her gum.  Did a treatment with 5-FU with dermatology on face- around memorial day - sometime in June. Issues started intermittently in June. New since her physical in may.   Has even woken her up 3x from sleep in last 3 weeks- but has started to slow down some. Woke up feeling like  tongue glued to top of mouth. Mouth guard helps- makes her salivate more.   Felt almost like a rash on inside of lower lip. Feels like it has healed up. No other new meds other than 5 FU. Has possibly changed toothpaste to arm and hammer- she switched back to her prior. Still on calcium/vitamin D/b12 but those formulations have not changed. No new foods she has tried or supplements.   Has not felt ill, no fever/chills. No history of being immunocompromised.   Does 64 oz of water eeach day for most part A/P: Possibly related to 5-FU treatment. Discussed possible sjogrens- could do bloodwork if persistent issue in 2 months or sooner if worsens. May need ENT opinoin  I am interested to see how you do if you do use another round  For now lets monitor -can consider testing for sjogrens if persistent outside of 5-FU treatment - try humidifier at night -consider strips for nose to make sure breathing through nose at night - consider sugar- free lozenges/candy to stimulate salivery glands -if new or worsening symptoms let me know. Or if symptoms lets say persist 3 months out from 5 FU treatment without improvement    Recommended follow up: keep physical in may- sooner visit if needed Future Appointments  Date Time Provider Department Center  12/23/2020  9:20 AM Shelva Majestic, MD [facility]    Lab/Order associations: No  diagnosis found.  No orders of the defined types were placed in this encounter.   Time Spent: 20 minutes of total time (9:30 AM- 9:50 AM) was spent on the date of the encounter performing the following actions: chart review prior to seeing the patient, obtaining history, performing a medically necessary exam, counseling on the treatment plan, placing orders, and documenting in our EHR.   Return precautions advised.  Tana Conch, MD

## 2020-03-10 ENCOUNTER — Ambulatory Visit (INDEPENDENT_AMBULATORY_CARE_PROVIDER_SITE_OTHER): Payer: BC Managed Care – PPO | Admitting: Family Medicine

## 2020-03-10 ENCOUNTER — Other Ambulatory Visit: Payer: Self-pay

## 2020-03-10 ENCOUNTER — Encounter: Payer: Self-pay | Admitting: Family Medicine

## 2020-03-10 VITALS — BP 122/84 | HR 61 | Temp 97.6°F | Ht 63.5 in | Wt 170.4 lb

## 2020-03-10 DIAGNOSIS — R682 Dry mouth, unspecified: Secondary | ICD-10-CM

## 2020-09-18 ENCOUNTER — Telehealth: Payer: Self-pay

## 2020-09-18 NOTE — Telephone Encounter (Signed)
Patient would like to know if she is due for a TDAP and whopping cough vaccine.

## 2020-09-18 NOTE — Telephone Encounter (Signed)
Called and spoke with pt and made aware that she is due for her tdap, she states she will get this vaccine at her pharmacy and will update [place] when she has done so.

## 2020-11-04 ENCOUNTER — Encounter: Payer: Self-pay | Admitting: Family Medicine

## 2020-11-04 LAB — HM MAMMOGRAPHY

## 2020-12-22 NOTE — Progress Notes (Signed)
Phone [phone]   Subjective:  Patient presents today for their annual physical. Chief complaint-noted.   See problem oriented charting- ROS- full  review of systems was completed and negative except for: decreased concentration with stress  The following were reviewed and entered/updated in epic: Past Medical History:  Diagnosis Date  . Arthritis    cervical (neck)  . Hypertension    Patient Active Problem List   Diagnosis Date Noted  . Hypertension 01/15/2013    Priority: Medium  . Abdominal bruit 05/19/2014    Priority: Low  . Numbness of left foot 05/19/2014    Priority: Low   Past Surgical History:  Procedure Laterality Date  . CESAREAN SECTION    . DILATION AND CURETTAGE OF UTERUS  may 2011   fibroids and polyps    Family History  Problem Relation Age of Onset  . Diabetes Father 29       heavy drinker  . Lung cancer Father        smoker    Medications- reviewed and updated Current Outpatient Medications  Medication Sig Dispense Refill  . calcium-vitamin D (OSCAL WITH D) 250-125 MG-UNIT tablet Take 1 tablet by mouth daily.    . Cobalamin Combinations (B-12) [phone] MCG SUBL B12     No current facility-administered medications for this visit.    Allergies-reviewed and updated Allergies  Allergen Reactions  . Amoxicillin Rash    Pt reported rash on back     Social History   Social History Narrative   Separated mid 2017 (ex spouse on hospice 2022 alcoholic cirrhosis complications)- Married 1985 (Rian). 2 daughters. 1 granddog. 1 granddaughter Brynda Greathouse in april 2022.    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 Oropharynx normal Neck: no thyromegaly CV: RRR no murmurs rubs or gallops Lungs: CTAB no crackles, wheeze, rhonchi Abdomen: soft/nontender/nondistended/normal bowel sounds. No rebound or guarding.  Ext: no edema Skin: warm, dry Neuro: grossly normal, moves all extremities, PERRLA   Assessment and Plan   65 y.o. female presenting for annual physical.  Health Maintenance counseling: 1. Anticipatory guidance: Patient counseled regarding regular dental exams -q6 months, eye exams - yearly,  avoiding smoking and second hand smoke , limiting alcohol to 1 beverage per day, no illicit drugs .   2. Risk factor reduction:  Advised patient of need for regular exercise and diet rich and fruits and vegetables to reduce risk of heart attack and stroke. Exercise- a lot of stress recently- still trying to get tennis in but struggle with life situation. Diet-intentional weight loss with her new significant other.  Wt Readings from Last 3 Encounters:  12/23/20 163 lb 12.8 oz (74.3 kg)  03/10/20 170 lb 6.4 oz (77.3 kg)  12/17/19 168 lb (76.2 kg)  3. Immunizations/screenings/ancillary studies-Shingrix was planned last year at pharmacy- we are going to hold off with upcoming travel, discussed COVID-19 vaccination booster- had in january  Immunization History  Administered Date(s) Administered  .  Influenza Whole 05/02/2011  . Influenza,inj,Quad PF,6+ Mos 05/18/2016, 07/07/2018  . Influenza-Unspecified 05/15/2014, 06/03/2017, 07/07/2018  . PFIZER(Purple Top)SARS-COV-2 Vaccination 10/04/2019, 11/05/2019, 08/09/2020  . Td 08/01/1997, 08/10/2010  . Tdap 10/15/2020  4. Cervical cancer screening- follows with gynecology regularly- last Pap smear in 2018 but also HPV negative so needs every 5 years.  Last intervention was with cryotherapy at age 26 5. Breast cancer screening-  breast exam with gynecology and mammogram  11/04/2020 6. Colon cancer screening -last done in 2010-no polyp or colon cancer history and family.  Ordered Cologuard last year but patient did not complete-we will attempt again today 7. Skin cancer screening-follows with dermatology- was just seen. advised regular sunscreen use. Denies worrisome, changing, or new skin lesions.  8. Birth control/STD check-  Monogamous declines STD screening. She is postmenopausal  9. 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 He is at [facility] right now- and her mom is in town and needs to drive mom to [facility] since June 2020.  Denies change in memory.  Denies history of trauma.  Has had CT of her sinuses which was nonrevealing.  Thought potentially related to a virus-has seen Dr. Pollyann Kennedy.  TSH and B12 are normal-patient's primary concern was Alzheimer's or Parkinson's and we referred to neurology for patient's request- request was denied  #hypertension S: medication: None, previously on amlodipine 2.5 mg-she wanted to come off medication to see if she could eat more grapefruit which she really enjoys Home readings #s: Has home cuff but has not checked recently   BP Readings from Last 3 Encounters:  12/23/20 (!) 154/90  03/10/20 122/84  12/17/19 122/78   A/P: Blood pressure has been very well controlled on recent checks-elevated today but patient under intense stress at the moment-I  asked her to wait for things to settle down and then to update me with home readings for a week once things are more settled  #Dry mouth/possible rash in mouth- thought potentially related to 5 fluorouracil treatment.  We also considered possible Sjogren's with plan to consider blood work if persistent.  We discussed possibly using strips on the nose to help nose breathing at night and sugar-free candies to stimulate salivary glands - thankfully did not recur even with 5 -FUI retretreamtnet  Recommended follow up: Return in about 1 year (around 12/23/2021) for physical or sooner if needed.  Lab/Order associations:NOT fasting- cheerios and rasberry   ICD-10-CM   1. Preventative health care  Z00.00   2. Primary hypertension  I10   3. Encounter for screening colonoscopy  Z12.11   4. Screening for colon cancer  Z12.11     No orders of the defined types were placed in this encounter.   Return precautions advised.  Tana Conch, MD

## 2020-12-22 NOTE — Patient Instructions (Addendum)
Schedule a lab visit at the check out desk within 2-3 weeks. Return for future fasting labs meaning nothing but water after midnight please. Ok to take your medications with water.   Health Maintenance Due  Topic Date Due  . COLONOSCOPY - Patient requests COLOGUARD. If you do not receive this within 3 weeks please give [place] a call.   11/25/2018  . PAP SMEAR-if you have had another pap since 2018 please have them send a copy at next visit  09/13/2019   Blood pressure has been very well controlled on recent checks-elevated today but patient under intense stress at the moment-I asked her to wait for things to settle down and then to update me with home readings for a week once things are more settled  Recommended follow up: Return in about 1 year (around 12/23/2021) for physical or sooner if needed.

## 2020-12-23 ENCOUNTER — Ambulatory Visit (INDEPENDENT_AMBULATORY_CARE_PROVIDER_SITE_OTHER): Payer: No Typology Code available for payment source | Admitting: Family Medicine

## 2020-12-23 ENCOUNTER — Other Ambulatory Visit: Payer: Self-pay

## 2020-12-23 ENCOUNTER — Encounter: Payer: Self-pay | Admitting: Family Medicine

## 2020-12-23 VITALS — BP 154/90 | HR 68 | Temp 98.0°F | Ht 64.0 in | Wt 163.8 lb

## 2020-12-23 DIAGNOSIS — I10 Essential (primary) hypertension: Secondary | ICD-10-CM | POA: Diagnosis not present

## 2020-12-23 DIAGNOSIS — Z1211 Encounter for screening for malignant neoplasm of colon: Secondary | ICD-10-CM

## 2020-12-23 DIAGNOSIS — Z0000 Encounter for general adult medical examination without abnormal findings: Secondary | ICD-10-CM

## 2021-01-04 ENCOUNTER — Other Ambulatory Visit (INDEPENDENT_AMBULATORY_CARE_PROVIDER_SITE_OTHER): Payer: No Typology Code available for payment source

## 2021-01-04 ENCOUNTER — Other Ambulatory Visit: Payer: Self-pay

## 2021-01-04 DIAGNOSIS — Z0000 Encounter for general adult medical examination without abnormal findings: Secondary | ICD-10-CM

## 2021-01-04 LAB — CBC WITH DIFFERENTIAL/PLATELET
Basophils Absolute: 0.1 10*3/uL (ref 0.0–0.1)
Basophils Relative: 1.3 % (ref 0.0–3.0)
Eosinophils Absolute: 0.2 10*3/uL (ref 0.0–0.7)
Eosinophils Relative: 3.6 % (ref 0.0–5.0)
HCT: 40.1 % (ref 36.0–46.0)
Hemoglobin: 13.8 g/dL (ref 12.0–15.0)
Lymphocytes Relative: 34.5 % (ref 12.0–46.0)
Lymphs Abs: 1.5 10*3/uL (ref 0.7–4.0)
MCHC: 34.3 g/dL (ref 30.0–36.0)
MCV: 92.6 fl (ref 78.0–100.0)
Monocytes Absolute: 0.3 10*3/uL (ref 0.1–1.0)
Monocytes Relative: 7.3 % (ref 3.0–12.0)
Neutro Abs: 2.3 10*3/uL (ref 1.4–7.7)
Neutrophils Relative %: 53.3 % (ref 43.0–77.0)
Platelets: 261 10*3/uL (ref 150.0–400.0)
RBC: 4.33 Mil/uL (ref 3.87–5.11)
RDW: 12.6 % (ref 11.5–15.5)
WBC: 4.4 10*3/uL (ref 4.0–10.5)

## 2021-01-04 LAB — LIPID PANEL
Cholesterol: 209 mg/dL — ABNORMAL HIGH (ref 0–200)
HDL: 95.9 mg/dL (ref >39.00)
LDL Cholesterol: 104 mg/dL — ABNORMAL HIGH (ref 0–99)
NonHDL: 113.15
Total CHOL/HDL Ratio: 2
Triglycerides: 47 mg/dL (ref 0.0–149.0)
VLDL: 9.4 mg/dL (ref 0.0–40.0)

## 2021-01-04 LAB — COMPREHENSIVE METABOLIC PANEL
ALT: 19 U/L (ref 0–35)
AST: 20 U/L (ref 0–37)
Albumin: 4.3 g/dL (ref 3.5–5.2)
Alkaline Phosphatase: 69 U/L (ref 39–117)
BUN: 17 mg/dL (ref 6–23)
CO2: 30 mEq/L (ref 19–32)
Calcium: 9.1 mg/dL (ref 8.4–10.5)
Chloride: 104 mEq/L (ref 96–112)
Creatinine, Ser: 0.76 mg/dL (ref 0.40–1.20)
GFR: 83.91 mL/min (ref >60.00)
Glucose, Bld: 83 mg/dL (ref 70–99)
Potassium: 3.6 mEq/L (ref 3.5–5.1)
Sodium: 140 mEq/L (ref 135–145)
Total Bilirubin: 0.9 mg/dL (ref 0.2–1.2)
Total Protein: 6.5 g/dL (ref 6.0–8.3)

## 2021-01-25 ENCOUNTER — Telehealth: Payer: Self-pay

## 2021-01-25 NOTE — Telephone Encounter (Signed)
-----   Message from Melissa Majestic, MD sent at 01/24/2021  9:53 AM EDT ----- Patient was going to send in some home blood pressures but I do not see those yet-can you double check the chart and then recheck patient if not present

## 2021-01-25 NOTE — Telephone Encounter (Signed)
Mychart message has been sent to follow up on this.

## 2021-02-03 LAB — COLOGUARD: Cologuard: NEGATIVE

## 2021-02-04 ENCOUNTER — Encounter: Payer: Self-pay | Admitting: Family Medicine

## 2021-04-09 ENCOUNTER — Telehealth: Payer: No Typology Code available for payment source | Admitting: Physician Assistant

## 2021-04-09 DIAGNOSIS — B9689 Other specified bacterial agents as the cause of diseases classified elsewhere: Secondary | ICD-10-CM | POA: Diagnosis not present

## 2021-04-09 DIAGNOSIS — J019 Acute sinusitis, unspecified: Secondary | ICD-10-CM | POA: Diagnosis not present

## 2021-04-09 MED ORDER — DOXYCYCLINE HYCLATE 100 MG PO TABS: 100.0000 mg | ORAL_TABLET | Freq: Two times a day (BID) | ORAL | 0 refills | Status: DC

## 2021-04-09 NOTE — Progress Notes (Signed)
Virtual Visit Consent   Melissa Dawson, you are scheduled for a virtual visit with a [facility] [facility] provider today.     Just as with appointments in the office, your consent must be obtained to participate.  Your consent will be active for this visit and any virtual visit you may have with one of our providers in the next 365 days.     If you have a MyChart account, a copy of this consent can be sent to you electronically.  All virtual visits are billed to your insurance company just like a traditional visit in the office.    As this is a virtual visit, video technology does not allow for your provider to perform a traditional examination.  This may limit your provider's ability to fully assess your condition.  If your provider identifies any concerns that need to be evaluated in person or the need to arrange testing (such as labs, EKG, etc.), we will make arrangements to do so.     Although advances in technology are sophisticated, we cannot ensure that it will always work on either your end or our end.  If the connection with a video visit is poor, the visit may have to be switched to a telephone visit.  With either a video or telephone visit, we are not always able to ensure that we have a secure connection.     I need to obtain your verbal consent now.   Are you willing to proceed with your visit today?    Melissa Dawson has provided verbal consent on 04/09/2021 for a virtual visit (video or telephone).   Margaretann Loveless, PA-C   Date: 04/09/2021 4:21 PM   Virtual Visit via Video Note   I, Margaretann Loveless, connected with  Melissa Dawson  ([id number], September 30, 1955) on 04/09/21 at  4:15 PM EDT by a video-enabled telemedicine application and verified that I am speaking with the correct person using two identifiers.  Location: Patient: Virtual Visit Location Patient: Home Provider: Virtual Visit Location Provider: Home Office   I discussed the limitations of  evaluation and management by telemedicine and the availability of in person appointments. The patient expressed understanding and agreed to proceed.    History of Present Illness: Melissa Dawson is a 65 y.o. who identifies as a female who was assigned female at birth, and is being seen today for possible sinusitis.  HPI: Sinusitis This is a new problem. The current episode started in the past 7 days. The problem has been gradually worsening since onset. The maximum temperature recorded prior to her arrival was 100.4 - 100.9 F. The fever has been present for 3 to 4 days. Associated symptoms include congestion, coughing, ear pain (right), headaches, sinus pressure and a sore throat. Pertinent negatives include no chills or shortness of breath. (Post nasal drainage) Treatments tried: delsym, sudafed. The treatment provided mild relief.   Home covid testing negative x 4  Problems:  Patient Active Problem List   Diagnosis Date Noted   Abdominal bruit 05/19/2014   Numbness of left foot 05/19/2014   Hypertension 01/15/2013    Allergies:  Allergies  Allergen Reactions   Amoxicillin Rash    Pt reported rash on back    Medications:  Current Outpatient Medications:    doxycycline (VIBRA-TABS) 100 MG tablet, Take 1 tablet (100 mg total) by mouth 2 (two) times daily., Disp: 20 tablet, Rfl: 0   calcium-vitamin D (OSCAL WITH D) 250-125 MG-UNIT tablet, Take 1  tablet by mouth daily., Disp: , Rfl:    Cobalamin Combinations (B-12) [phone] MCG SUBL, B12, Disp: , Rfl:   Observations/Objective: Patient is well-developed, well-nourished in no acute distress.  Resting comfortably at home.  Head is normocephalic, atraumatic.  No labored breathing.  Speech is clear and coherent with logical content.  Patient is alert and oriented at baseline.    Assessment and Plan: 1. Acute bacterial sinusitis - doxycycline (VIBRA-TABS) 100 MG tablet; Take 1 tablet (100 mg total) by mouth 2 (two) times daily.   Dispense: 20 tablet; Refill: 0  - Worsening symptoms that have not responded to OTC medications.  - Will give Doxycycline as below.  - Continue allergy medications.  - Stay well hydrated and get plenty of rest.  - Seek in person evaluation if no symptom improvement or if symptoms worsen.  Follow Up Instructions: I discussed the assessment and treatment plan with the patient. The patient was provided an opportunity to ask questions and all were answered. The patient agreed with the plan and demonstrated an understanding of the instructions.  A copy of instructions were sent to the patient via MyChart.  The patient was advised to call back or seek an in-person evaluation if the symptoms worsen or if the condition fails to improve as anticipated.  Time:  I spent 9 minutes with the patient via telehealth technology discussing the above problems/concerns.    Margaretann Loveless, PA-C

## 2021-04-09 NOTE — Patient Instructions (Signed)
Melissa Dawson, thank you for joining Margaretann Loveless, PA-C for today's virtual visit.  While this provider is not your primary care provider (PCP), if your PCP is located in our provider database this encounter information will be shared with them immediately following your visit.  Consent: (Patient) Melissa Dawson provided verbal consent for this virtual visit at the beginning of the encounter.  Current Medications:  Current Outpatient Medications:    doxycycline (VIBRA-TABS) 100 MG tablet, Take 1 tablet (100 mg total) by mouth 2 (two) times daily., Disp: 20 tablet, Rfl: 0   calcium-vitamin D (OSCAL WITH D) 250-125 MG-UNIT tablet, Take 1 tablet by mouth daily., Disp: , Rfl:    Cobalamin Combinations (B-12) [phone] MCG SUBL, B12, Disp: , Rfl:    Medications ordered in this encounter:  Meds ordered this encounter  Medications   doxycycline (VIBRA-TABS) 100 MG tablet    Sig: Take 1 tablet (100 mg total) by mouth 2 (two) times daily.    Dispense:  20 tablet    Refill:  0    Order Specific Question:   Supervising Provider    Answer:   Hyacinth Meeker, BRIAN [[id number]]     *If you need refills on other medications prior to your next appointment, please contact your pharmacy*  Follow-Up: Call back or seek an in-person evaluation if the symptoms worsen or if the condition fails to improve as anticipated.  Other Instructions Sinusitis, Adult Sinusitis is inflammation of your sinuses. Sinuses are hollow spaces in the bones around your face. Your sinuses are located: Around your eyes. In the middle of your forehead. Behind your nose. In your cheekbones. Mucus normally drains out of your sinuses. When your nasal tissues become inflamed or swollen, mucus can become trapped or blocked. This allows bacteria, viruses, and fungi to grow, which leads to infection. Most infections of the sinuses are caused by a virus. Sinusitis can develop quickly. It can last for up to 4 weeks (acute)  or for more than 12 weeks (chronic). Sinusitis often develops after a cold. What are the causes? This condition is caused by anything that creates swelling in the sinuses or stops mucus from draining. This includes: Allergies. Asthma. Infection from bacteria or viruses. Deformities or blockages in your nose or sinuses. Abnormal growths in the nose (nasal polyps). Pollutants, such as chemicals or irritants in the air. Infection from fungi (rare). What increases the risk? You are more likely to develop this condition if you: Have a weak body defense system (immune system). Do a lot of swimming or diving. Overuse nasal sprays. Smoke. What are the signs or symptoms? The main symptoms of this condition are pain and a feeling of pressure around the affected sinuses. Other symptoms include: Stuffy nose or congestion. Thick drainage from your nose. Swelling and warmth over the affected sinuses. Headache. Upper toothache. A cough that may get worse at night. Extra mucus that collects in the throat or the back of the nose (postnasal drip). Decreased sense of smell and taste. Fatigue. A fever. Sore throat. Bad breath. How is this diagnosed? This condition is diagnosed based on: Your symptoms. Your medical history. A physical exam. Tests to find out if your condition is acute or chronic. This may include: Checking your nose for nasal polyps. Viewing your sinuses using a device that has a light (endoscope). Testing for allergies or bacteria. Imaging tests, such as an MRI or CT scan. In rare cases, a bone biopsy may be done to rule out more  serious types of fungal sinus disease. How is this treated? Treatment for sinusitis depends on the cause and whether your condition is chronic or acute. If caused by a virus, your symptoms should go away on their own within 10 days. You may be given medicines to relieve symptoms. They include: Medicines that shrink swollen nasal passages (topical  intranasal decongestants). Medicines that treat allergies (antihistamines). A spray that eases inflammation of the nostrils (topical intranasal corticosteroids). Rinses that help get rid of thick mucus in your nose (nasal saline washes). If caused by bacteria, your health care provider may recommend waiting to see if your symptoms improve. Most bacterial infections will get better without antibiotic medicine. You may be given antibiotics if you have: A severe infection. A weak immune system. If caused by narrow nasal passages or nasal polyps, you may need to have surgery. Follow these instructions at home: Medicines Take, use, or apply over-the-counter and prescription medicines only as told by your health care provider. These may include nasal sprays. If you were prescribed an antibiotic medicine, take it as told by your health care provider. Do not stop taking the antibiotic even if you start to feel better. Hydrate and humidify  Drink enough fluid to keep your urine pale yellow. Staying hydrated will help to thin your mucus. Use a cool mist humidifier to keep the humidity level in your home above 50%. Inhale steam for 10-15 minutes, 3-4 times a day, or as told by your health care provider. You can do this in the bathroom while a hot shower is running. Limit your exposure to cool or dry air. Rest Rest as much as possible. Sleep with your head raised (elevated). Make sure you get enough sleep each night. General instructions  Apply a warm, moist washcloth to your face 3-4 times a day or as told by your health care provider. This will help with discomfort. Wash your hands often with soap and water to reduce your exposure to germs. If soap and water are not available, use hand sanitizer. Do not smoke. Avoid being around people who are smoking (secondhand smoke). Keep all follow-up visits as told by your health care provider. This is important. Contact a health care provider if: You have a  fever. Your symptoms get worse. Your symptoms do not improve within 10 days. Get help right away if: You have a severe headache. You have persistent vomiting. You have severe pain or swelling around your face or eyes. You have vision problems. You develop confusion. Your neck is stiff. You have trouble breathing. Summary Sinusitis is soreness and inflammation of your sinuses. Sinuses are hollow spaces in the bones around your face. This condition is caused by nasal tissues that become inflamed or swollen. The swelling traps or blocks the flow of mucus. This allows bacteria, viruses, and fungi to grow, which leads to infection. If you were prescribed an antibiotic medicine, take it as told by your health care provider. Do not stop taking the antibiotic even if you start to feel better. Keep all follow-up visits as told by your health care provider. This is important. This information is not intended to replace advice given to you by your health care provider. Make sure you discuss any questions you have with your health care provider. Document Revised: 12/18/2017 Document Reviewed: 12/18/2017 Elsevier Patient Education  2022 [employer].    If you have been instructed to have an in-person evaluation today at a local Urgent Care facility, please use the link below.  It will take you to a list of all of our available [facility], including address, phone number and hours of operation. Please do not delay care.  [facility]  If you or a family member do not have a primary care provider, use the link below to schedule a visit and establish care. When you choose a [facility] primary care physician or advanced practice provider, you gain a long-term partner in health. Find a Primary Care Provider  Learn more about [facility]'s in-office and virtual care options: [facility]

## 2021-06-21 ENCOUNTER — Telehealth: Payer: No Typology Code available for payment source | Admitting: Physician Assistant

## 2021-06-21 DIAGNOSIS — B9689 Other specified bacterial agents as the cause of diseases classified elsewhere: Secondary | ICD-10-CM | POA: Diagnosis not present

## 2021-06-21 DIAGNOSIS — J019 Acute sinusitis, unspecified: Secondary | ICD-10-CM | POA: Diagnosis not present

## 2021-06-21 MED ORDER — DOXYCYCLINE HYCLATE 100 MG PO TABS: 100.0000 mg | ORAL_TABLET | Freq: Two times a day (BID) | ORAL | 0 refills | Status: DC

## 2021-06-21 NOTE — Progress Notes (Signed)

## 2021-11-10 LAB — HM MAMMOGRAPHY

## 2021-11-23 LAB — HM PAP SMEAR

## 2022-01-03 ENCOUNTER — Ambulatory Visit (INDEPENDENT_AMBULATORY_CARE_PROVIDER_SITE_OTHER): Payer: No Typology Code available for payment source | Admitting: Family Medicine

## 2022-01-03 ENCOUNTER — Encounter: Payer: Self-pay | Admitting: Family Medicine

## 2022-01-03 VITALS — BP 130/80 | HR 63 | Temp 97.9°F | Ht 64.0 in | Wt 167.0 lb

## 2022-01-03 DIAGNOSIS — Z0000 Encounter for general adult medical examination without abnormal findings: Secondary | ICD-10-CM

## 2022-01-03 DIAGNOSIS — Z23 Encounter for immunization: Secondary | ICD-10-CM | POA: Diagnosis not present

## 2022-01-03 DIAGNOSIS — I10 Essential (primary) hypertension: Secondary | ICD-10-CM

## 2022-01-03 DIAGNOSIS — E785 Hyperlipidemia, unspecified: Secondary | ICD-10-CM

## 2022-01-03 LAB — COMPREHENSIVE METABOLIC PANEL
ALT: 15 U/L (ref 0–35)
AST: 17 U/L (ref 0–37)
Albumin: 4.3 g/dL (ref 3.5–5.2)
Alkaline Phosphatase: 71 U/L (ref 39–117)
BUN: 16 mg/dL (ref 6–23)
CO2: 30 mEq/L (ref 19–32)
Calcium: 9.3 mg/dL (ref 8.4–10.5)
Chloride: 105 mEq/L (ref 96–112)
Creatinine, Ser: 0.65 mg/dL (ref 0.40–1.20)
GFR: 93.62 mL/min (ref >60.00)
Glucose, Bld: 87 mg/dL (ref 70–99)
Potassium: 4.3 mEq/L (ref 3.5–5.1)
Sodium: 142 mEq/L (ref 135–145)
Total Bilirubin: 0.8 mg/dL (ref 0.2–1.2)
Total Protein: 6.2 g/dL (ref 6.0–8.3)

## 2022-01-03 LAB — LIPID PANEL
Cholesterol: 205 mg/dL — ABNORMAL HIGH (ref 0–200)
HDL: 89.9 mg/dL (ref >39.00)
LDL Cholesterol: 103 mg/dL — ABNORMAL HIGH (ref 0–99)
NonHDL: 115.34
Total CHOL/HDL Ratio: 2
Triglycerides: 62 mg/dL (ref 0.0–149.0)
VLDL: 12.4 mg/dL (ref 0.0–40.0)

## 2022-01-03 LAB — CBC WITH DIFFERENTIAL/PLATELET
Basophils Absolute: 0.1 10*3/uL (ref 0.0–0.1)
Basophils Relative: 2.1 % (ref 0.0–3.0)
Eosinophils Absolute: 0.1 10*3/uL (ref 0.0–0.7)
Eosinophils Relative: 3.6 % (ref 0.0–5.0)
HCT: 40 % (ref 36.0–46.0)
Hemoglobin: 13.4 g/dL (ref 12.0–15.0)
Lymphocytes Relative: 37.2 % (ref 12.0–46.0)
Lymphs Abs: 1.5 10*3/uL (ref 0.7–4.0)
MCHC: 33.7 g/dL (ref 30.0–36.0)
MCV: 93.2 fl (ref 78.0–100.0)
Monocytes Absolute: 0.3 10*3/uL (ref 0.1–1.0)
Monocytes Relative: 7.7 % (ref 3.0–12.0)
Neutro Abs: 2 10*3/uL (ref 1.4–7.7)
Neutrophils Relative %: 49.4 % (ref 43.0–77.0)
Platelets: 236 10*3/uL (ref 150.0–400.0)
RBC: 4.29 Mil/uL (ref 3.87–5.11)
RDW: 12.5 % (ref 11.5–15.5)
WBC: 4.1 10*3/uL (ref 4.0–10.5)

## 2022-01-03 LAB — TSH: TSH: 2.22 u[IU]/mL (ref 0.35–5.50)

## 2022-01-03 NOTE — Addendum Note (Signed)
Addended by: Lieutenant Diego A on: 01/03/2022 10:41 AM   Modules accepted: Orders

## 2022-01-03 NOTE — Progress Notes (Signed)
Phone [phone]   Subjective:  Patient presents today for their annual physical. Chief complaint-noted.   See problem oriented charting- ROS- full  review of systems was completed and negative except for: prior numbness of left foot, some right foot pain from plantar fasciitis, right shoulder pain  The following were reviewed and entered/updated in epic: Past Medical History:  Diagnosis Date   Arthritis    cervical (neck)   Hypertension    Patient Active Problem List   Diagnosis Date Noted   Hypertension 01/15/2013    Priority: Medium    Abdominal bruit 05/19/2014    Priority: Low   Numbness of left foot 05/19/2014    Priority: Low   Past Surgical History:  Procedure Laterality Date   CESAREAN SECTION     DILATION AND CURETTAGE OF UTERUS  may 2011   fibroids and polyps    Family History  Problem Relation Age of Onset   Diabetes Father 25       heavy drinker   Lung cancer Father        smoker    Medications- reviewed and updated No current outpatient medications on file.   No current facility-administered medications for this visit.    Allergies-reviewed and updated Allergies  Allergen Reactions   Amoxicillin Rash    Pt reported rash on back     Social History   Social History Narrative   Remarried may 2023.    Prior Divorced 11/19/20. Separated mid 11-20-15 (ex spouse died in Nov 19, 2020 alcoholic cirrhosis complications- married 11/20/1983). 2 daughters. 1 daughter in [place]-  1 granddaughter Brynda Greathouse in 11/19/2020. Other daughter in [place] cannot have children due to prior radiation.     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 Oropharynx normal Neck: no thyromegaly CV: RRR no murmurs rubs or gallops Lungs: CTAB no crackles, wheeze, rhonchi Abdomen: soft/nontender/nondistended/normal bowel sounds. No rebound or guarding.  Ext: no edema Skin: warm, dry Neuro: grossly normal, moves all extremities, PERRLA Right Shoulder: Inspection reveals no abnormalities, atrophy or asymmetry. Palpation is normal with no tenderness over AC joint or bicipital groove. ROM is full in all planes. Rotator cuff strength normal throughout. No signs of impingement with negative Neer and Hawkin's tests, empty can- other than slight tenderness with Neer.  No painful arc and no drop arm sign.    Assessment and Plan   66 y.o. female presenting for annual physical.  Health Maintenance counseling: 1. Anticipatory guidance: Patient counseled regarding regular dental exams -q6 months, eye exams - yearly,  avoiding smoking and second hand smoke , limiting alcohol to 1 beverage per day , no illicit drugs.   2. Risk factor reduction:  Advised patient of need for regular exercise and diet rich and fruits and vegetables to reduce risk of heart attack and stroke.  Exercise- planning to restart with retirement and with right shoulder once healed. Planning to at least walk. Considering yoga Diet/weight management-mild weight gain  in the last year but had lost your prior-overall trend is downward Wt Readings from Last 3 Encounters:  01/03/22 167 lb (75.8 kg)  12/23/20 163 lb 12.8 oz (74.3 kg)  03/10/20 170 lb 6.4 oz (77.3 kg)  3. Immunizations/screenings/ancillary studies- Shingrix today. Considering covid bivalent booster before cruise to [place] Immunization History  Administered Date(s) Administered   Influenza Whole 05/02/2011   Influenza,inj,Quad PF,6+ Mos 05/18/2016, 07/07/2018   Influenza-Unspecified 05/15/2014, 06/03/2017, 07/07/2018   PFIZER(Purple  Top)SARS-COV-2 Vaccination 10/04/2019, 11/05/2019, 08/09/2020   Td 08/01/1997, 08/10/2010   Tdap 10/15/2020   4. Cervical cancer screening- appears to have seen GYN on 11/23/2021.  Cryotherapy age 51 but no significant issues since that time 5. Breast cancer screening-  breast exam with GYN and mammogram 11/10/2021 on file 6. Colon cancer screening - Cologuard 02/03/2021 good for 3 years 7. Skin cancer screening- Dr. Roderic Scarce last week and was given 3 years/good report. advised regular sunscreen use. Denies worrisome, changing, or new skin lesions.  8. Birth control/STD check- monogamous-declines STD screening.  Postmenopausal 9. Osteoporosis screening at 12- prefers to wait until age 22 10. Smoking associated screening -never smoker- plus did urine with GYn  Status of chronic or acute concerns   #hypertension S: medication: None, previously on amlodipine 2.5 mg-she wanted to come off medication to see if she could eat more grapefruit which she really enjoys Home readings #s: 118/84 at gyn within the last few months. Sporadic home checks in similar ranges BP Readings from Last 3 Encounters:  01/03/22 130/80  12/23/20 (!) 154/90  03/10/20 122/84   A/P: well controlled without medicine- continue to monitor  #Right shoulder pain S: Patient tripped last month at her wedding and grabbed awkwardly onto a railing-she reports straining right knee but better but where she grabbed onto rail with right hand and strained backwards- has not played tennis since that time.   A/P: appears to have mild rotator cuff strain- but improving- she will let us know if fails to continue to improve- encouraged gradually getting back into tennis. Instead of 90 mins perhaps 15-30 for first week or two    #hyperlipidemia S: Medication: None Lab Results  Component Value Date   CHOL 209 (H) 01/04/2021   HDL 95.90 01/04/2021   LDLCALC 104 (H) 01/04/2021   LDLDIRECT 93.8 08/10/2010   TRIG 47.0 01/04/2021   CHOLHDL 2  01/04/2021   A/P: The 10-year ASCVD risk score (Arnett DK, et al., 2019) is: 3.6%.  Thankfully 10-year ASCVD risk not significantly elevated-continue focus on healthy eating/regular exercise-good control of blood pressure is helpful-we will check TSH with labs  Recommended follow up: Return in about 1 year (around 01/04/2023) for physical or sooner if needed.Schedule b4 you leave.  Lab/Order associations: fasting   ICD-10-CM   1. Preventative health care  Z00.00 CBC with Differential/Platelet    Comprehensive metabolic panel    Lipid panel    TSH    CANCELED: Vitamin B12    2. Primary hypertension  I10 CBC with Differential/Platelet    Comprehensive metabolic panel    Lipid panel    TSH    CANCELED: Vitamin B12    3. Mild hyperlipidemia  E78.5 CBC with Differential/Platelet    Lipid panel    TSH      No orders of the defined types were placed in this encounter.   Return precautions advised.  Tana Conch, MD

## 2022-01-03 NOTE — Patient Instructions (Addendum)
Happy to see your husband as a patient  Thanks for doing labs If you have mychart- we will send your results within 3 business days of [place] receiving them.  If you do not have mychart- we will call you about results within 5 business days of [place] receiving them.  *please also note that you will see labs on mychart as soon as they post. I will later go in and write notes on them- will say "notes from Dr. Durene Cal"   Shingrix #1 today. Repeat injection in 2-5 months. Schedule a nurse visit for the 2nd injection before you leave today (at the check out desk)  Recommended follow up: Return in about 1 year (around 01/04/2023) for physical or sooner if needed.Schedule b4 you leave.

## 2022-03-15 ENCOUNTER — Ambulatory Visit (INDEPENDENT_AMBULATORY_CARE_PROVIDER_SITE_OTHER): Payer: No Typology Code available for payment source

## 2022-03-15 DIAGNOSIS — Z23 Encounter for immunization: Secondary | ICD-10-CM | POA: Diagnosis not present

## 2022-03-15 NOTE — Progress Notes (Signed)
Pt came in for second Shingrix vaccine today, injection was administered in R deltoid and tolerated well. Pt advised 1st vaccine was minimal soreness and no other issues.

## 2022-04-25 ENCOUNTER — Encounter: Payer: Self-pay | Admitting: *Deleted

## 2022-07-14 ENCOUNTER — Encounter: Payer: Self-pay | Admitting: *Deleted

## 2022-11-16 LAB — HM MAMMOGRAPHY

## 2022-12-01 LAB — BASIC METABOLIC PANEL
BUN: 11 (ref 4–21)
Creatinine: 0.7 (ref 0.5–1.1)
Glucose: 88

## 2022-12-01 LAB — LIPID PANEL
Cholesterol: 239 — AB (ref 0–200)
Triglycerides: 70 (ref 40–160)

## 2022-12-01 LAB — COMPREHENSIVE METABOLIC PANEL
Albumin: 4.7 (ref 3.5–5.0)
Globulin: 21

## 2022-12-01 LAB — HEPATIC FUNCTION PANEL
ALT: 17 U/L (ref 7–35)
AST: 22 (ref 13–35)

## 2022-12-01 LAB — PROTEIN / CREATININE RATIO, URINE: Creatinine, Urine: 24

## 2023-01-05 ENCOUNTER — Ambulatory Visit (INDEPENDENT_AMBULATORY_CARE_PROVIDER_SITE_OTHER): Payer: No Typology Code available for payment source | Admitting: Family Medicine

## 2023-01-05 ENCOUNTER — Encounter: Payer: Self-pay | Admitting: Family Medicine

## 2023-01-05 VITALS — BP 152/90 | HR 60 | Temp 98.0°F | Resp 16 | Ht 64.0 in | Wt 171.4 lb

## 2023-01-05 DIAGNOSIS — Z0000 Encounter for general adult medical examination without abnormal findings: Secondary | ICD-10-CM | POA: Diagnosis not present

## 2023-01-05 DIAGNOSIS — E785 Hyperlipidemia, unspecified: Secondary | ICD-10-CM

## 2023-01-05 DIAGNOSIS — I10 Essential (primary) hypertension: Secondary | ICD-10-CM

## 2023-01-05 NOTE — Patient Instructions (Addendum)
home blood pressure has looked really good overall- she is going of the take it 2-3 x over the next week and update me- for now remain off medicine  Trial aleve or ibuprofen- aleve twice a day or ibuprofen 400 mg 3x a day Team please give her exercises for SI joint inflammation and greater trochanteric bursitis I want you to do the exercise 3x a week for a month then once a week for another month. Stop any exercise that causes more than 1-2/10 pain increase. If not doing better within 1-2 months let us refer you to physical therapy as first step   Recommended follow up: Return in about 1 year (around 01/05/2024) for physical or sooner if needed.Schedule b4 you leave.

## 2023-01-05 NOTE — Progress Notes (Signed)
Phone [phone]   Subjective:  Patient presents today for their annual physical. Chief complaint-noted.   See problem oriented charting- ROS- full  review of systems was completed and negative except for: Left hip pain  The following were reviewed and entered/updated in epic: Past Medical History:  Diagnosis Date   Arthritis    cervical (neck)   Hypertension    Patient Active Problem List   Diagnosis Date Noted   Hypertension 01/15/2013    Priority: Medium    Abdominal bruit 05/19/2014    Priority: Low   Numbness of left foot 05/19/2014    Priority: Low   Past Surgical History:  Procedure Laterality Date   CESAREAN SECTION     DILATION AND CURETTAGE OF UTERUS  may 2011   fibroids and polyps   Family History  Problem Relation Age of Onset   Diabetes Father 7       heavy drinker   Lung cancer Father        smoker   Medications- reviewed and updated No current outpatient medications on file.   No current facility-administered medications for this visit.   Allergies-reviewed and updated Allergies  Allergen Reactions   Amoxicillin Rash    Pt reported rash on back    Social History   Social History Narrative   Remarried may 2023.    Prior Divorced 02-Feb-2021. Separated mid 02-03-16 (ex spouse died in 02-Feb-2021 alcoholic cirrhosis complications- married 02/03/84). 2 daughters. 1 daughter in [place]-  1 granddaughter Brynda Greathouse in april 2022. Other daughter in [place] cannot have children due to prior radiation.     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 Oropharynx normal Neck: no thyromegaly CV: RRR no murmurs rubs or gallops Lungs: CTAB no crackles, wheeze, rhonchi Abdomen: soft/nontender/nondistended/normal bowel sounds. No rebound or guarding.  Ext: no edema Skin: warm, dry Neuro: grossly normal, moves all extremities, PERRLA Right hip exam normal- on left mild pain over greater trochanter and some pain over SI joint as well as pain with FABER- otherwise normal    Assessment and Plan   67 y.o. female presenting for annual physical.  Health Maintenance counseling: 1. Anticipatory guidance: Patient counseled regarding regular dental exams -q6 months, eye exams - yearly,  avoiding smoking and second hand smoke , limiting alcohol to 1 beverage per day- 1 beverage per week , no illicit drugs .   2. Risk factor reduction:  Advised patient of need for regular exercise and diet rich and fruits and vegetables to reduce risk of heart attack and stroke.  Exercise-has played tennis 4 days a week in doubles- has done a few classes - [employer] classes shift schedule and has been hard Diet/weight management-up 4 pounds from last year-discussed reversing this trend- feels like with remarriage food has been more available and has been harder to watch portions and caloric intake.  Wt Readings from Last 3 Encounters:  01/05/23 171 lb 6.4 oz (77.7 kg)  01/03/22 167 lb (75.8 kg)  12/23/20 163 lb 12.8 oz (74.3 kg)  3. Immunizations/screenings/ancillary studies-fully up-to-date on immunizations-recommended COVID shot in the fall when new 1 released but she may get one now as last had the bivalent and has upcoming travel in 8 days- consider at pharmacy Immunization History  Administered Date(s) Administered   Influenza Whole 05/02/2011   Influenza,inj,Quad PF,6+ Mos 05/18/2016, 07/07/2018   Influenza-Unspecified 05/15/2014, 06/03/2017, 07/07/2018   PFIZER(Purple Top)SARS-COV-2 Vaccination 10/04/2019, 11/05/2019, 08/09/2020   Pfizer  Covid-19 Vaccine Bivalent Booster 49yrs & up 04/01/2022   Td 08/01/1997, 08/10/2010   Tdap 10/15/2020   Zoster Recombinat (Shingrix) 01/03/2022, 03/15/2022  4. Cervical cancer screening- appears to have seen GYN on 11/23/2021.  Cryotherapy age 21 but no significant issues since that time. Pap 11/23/2021 on file reported normal on abstraction- basically was told optional 5. Breast cancer screening-  breast exam with GYN and mammogram 11/16/2022  on file 6. Colon cancer screening - Cologuard 02/03/2021 good for 3 years- due next year  7. Skin cancer screening- Dr. Roderic Scarce last year and was given 3 years/good report-2026 next visit.  advised regular sunscreen use. Denies worrisome, changing, or new skin lesions.  8. Birth control/STD check- monogamous-declines STD screening.  Postmenopausal  9. Osteoporosis screening at 60- prefers to wait until age 86  10. Smoking associated screening -never smoker- plus did urine with testing for insurance   Status of chronic or acute concerns   #hypertension S: medication: None, previously on amlodipine 2.5 mg-she wanted to come off medication to see if she could eat more grapefruit which she really enjoys Home readings #s: Has home cuff- has been normal when checked - 138 at its peak but has been in 120s BP Readings from Last 3 Encounters:  01/05/23 (!) 152/90  01/03/22 130/80  12/23/20 (!) 154/90  A/P: home blood pressure has looked really good overall- she is going of the take it 2-3 x over the next week and update me- for now remain off medicine -had urine that was normal recently. CMP normal as well  #hyperlipidemia S: Medication:none. The 10-year ASCVD risk score (Arnett DK, et al., 2019) is: 6.1%* (Cholesterol units were assumed)  -had recent labs and cholesterol was up to 239 on that as well as HDL 97, LDL up to 128, triglycerides roughly stable at 70  Lab Results  Component Value Date   CHOL 239 (A) 12/01/2022   HDL 89.90 01/03/2022   LDLCALC 103  (H) 01/03/2022   LDLDIRECT 93.8 08/10/2010   TRIG 70 12/01/2022   CHOLHDL 2 01/03/2022   A/P: lipids not high enough to start statin and in addition I think her risk is actually lower because home blood pressure is better- we opted to hold off on medications or further evaluation with CT calcium scoring  # Left-sided hip pain S: Patient has noted this for several months.  Denies any fall or injury.  Has started to affect her ability to sleep sometimes- can't sleep on her left side anymore and prefers to sleep on that side- able to sleep on right side but not as comfortable for her. Hurts left lateral buttocks mainly . No groin pain. Pain at worst 4/10 with laying on it or getting up in morning - or sitting down then getting up- better with movement A/P: suspect combo of mild greater trochanteric bursitis plus SI joint inflammatoin Trial aleve or ibuprofen- aleve twice a day or ibuprofen 400 mg 3x  a day Team please give her exercises for SI joint inflammation and greater trochanteric bursitis I want you to do the exercise 3x a week for a month then once a week for another month. Stop any exercise that causes more than 1-2/10 pain increase. If not doing better within 1-2 months let us refer you to physical therapy as first step     #right shoulder last year-largely resolved   #Screening for diabetes 1 month ago- a1c was 5.2 and fructosamine normal  Recommended follow up: Return in about 1 year (around 01/05/2024) for physical or sooner if needed.Schedule b4 you leave.  Lab/Order associations: fasting but we jointly agreed to hold off on labs as just had on 12/01/22 for insurance- team will make copy, abstract and scan in     ICD-10-CM   1. Preventative health care  Z00.00     2. Primary hypertension  I10     3. Hyperlipidemia, unspecified hyperlipidemia type  E78.5       No orders of the defined types were placed in this encounter.   Return precautions advised.  Tana Conch, MD

## 2023-09-05 ENCOUNTER — Ambulatory Visit: Payer: Self-pay | Admitting: Family Medicine

## 2023-09-05 ENCOUNTER — Encounter: Payer: Self-pay | Admitting: Family Medicine

## 2023-09-05 ENCOUNTER — Ambulatory Visit (INDEPENDENT_AMBULATORY_CARE_PROVIDER_SITE_OTHER): Payer: PPO | Admitting: Family Medicine

## 2023-09-05 VITALS — BP 144/88 | HR 78 | Temp 97.0°F | Ht 64.0 in | Wt 175.0 lb

## 2023-09-05 DIAGNOSIS — R21 Rash and other nonspecific skin eruption: Secondary | ICD-10-CM | POA: Diagnosis not present

## 2023-09-05 DIAGNOSIS — R197 Diarrhea, unspecified: Secondary | ICD-10-CM

## 2023-09-05 DIAGNOSIS — I10 Essential (primary) hypertension: Secondary | ICD-10-CM | POA: Diagnosis not present

## 2023-09-05 DIAGNOSIS — H9209 Otalgia, unspecified ear: Secondary | ICD-10-CM

## 2023-09-05 NOTE — Patient Instructions (Signed)
 It was very nice to see you today!  I am glad that your rash is improving.  This may have been a very mild shingles outbreak.  It could have also been molluscum contagiosum.  Regardless this should continue to improve over the next couple of weeks.  Return if symptoms worsen or fail to improve.   Take care, Dr Kennyth  PLEASE NOTE:  If you had any lab tests, please let us  know if you have not heard back within a few days. You may see your results on mychart before we have a chance to review them but we will give you a call once they are reviewed by us .   If we ordered any referrals today, please let us  know if you have not heard from their office within the next week.   If you had any urgent prescriptions sent in today, please check with the pharmacy within an hour of our visit to make sure the prescription was transmitted appropriately.   Please try these tips to maintain a healthy lifestyle:  Eat at least 3 REAL meals and 1-2 snacks per day.  Aim for no more than 5 hours between eating.  If you eat breakfast, please do so within one hour of getting up.   Each meal should contain half fruits/vegetables, one quarter protein, and one quarter carbs (no bigger than a computer mouse)  Cut down on sweet beverages. This includes juice, soda, and sweet tea.   Drink at least 1 glass of water with each meal and aim for at least 8 glasses per day  Exercise at least 150 minutes every week.

## 2023-09-05 NOTE — Progress Notes (Signed)
   Melissa Dawson is a 68 y.o. female who presents today for an office visit.  Assessment/Plan:  Rash It is very reassuring that symptoms have improved significantly in the last few days.  Appearance of rash on her phone and on today's exam consistent with possible attenuated shingles outbreak versus molluscum contagiosum.  Given that symptoms are improving do not think we need to do any further workup at this point.  No signs of systemic illness or bacterial infection.  We reassured patient.  Dissipate this will continue to improve over the next 1 to 2 weeks.  We discussed reasons to return to care.  Follow-up as needed.  Diarrhea Symptoms are improving as rash is improving.  This was likely due to the above viral process that was causing her rash.  Anticipate this will continue to improve though she will let us  know if symptoms return  Otalgia Reassuring exam today.  She does have small amount of irritation in EAC but no signs of otitis externa.  She will let us  know if this does not improve.  Chronic Problems Addressed Today: Hypertension Elevated today.  Home readings typically at goal.  She is not on any meds for this.  She will continue to monitor at home and follow-up with PCP if still elevated.    Subjective:  HPI:  Patient here with rasg on right lower quadrant of her abdomen. This has been going on for a few weeks.  No obvious exposures or other precipitating events.  Initially located on her right lower quadrant is a small cyst.  This become more red and inflamed.  She additionally subsequently developed a couple of smaller lesions next to this as well.  Symptoms were progressing for a few weeks however over the last several days she has noticed that the lesions do seem to be decreasing in size.  The largest lesion did start to peel off and seems to be improving.  Over this same length of time she has been having intermittent issues with diarrhea and loose stools.  This has  improved as the rash is improved the last several days.  She did go snorkeling recently has had some more irritation in her right EAC.  No hearing loss.  No drainage.       Objective:  Physical Exam: BP (!) 144/88   Pulse 78   Temp (!) 97 F (36.1 C) (Temporal)   Ht 5' 4 (1.626 m)   Wt 175 lb (79.4 kg)   SpO2 98%   BMI 30.04 kg/m   Gen: No acute distress, resting comfortably HEENT: Bilateral EAC with slight erythema.  No exudates or drainage.  TMs clear. Skin: Resolving vesicular lesion approximately 4 mm in diameter with a satellite 2 mm vesicular lesion intact.  See below.  No fluctuance.  No pain with palpation.  No allodynia or paresthesias. Neuro: Grossly normal, moves all extremities Psych: Normal affect and thought content        Thaddeus Evitts M. Kennyth, MD 09/05/2023 2:49 PM

## 2023-09-18 ENCOUNTER — Telehealth: Payer: PPO | Admitting: Physician Assistant

## 2023-09-18 DIAGNOSIS — R3989 Other symptoms and signs involving the genitourinary system: Secondary | ICD-10-CM

## 2023-09-18 MED ORDER — SULFAMETHOXAZOLE-TRIMETHOPRIM 800-160 MG PO TABS: 1.0000 | ORAL_TABLET | Freq: Two times a day (BID) | ORAL | 0 refills | Status: DC

## 2023-09-18 NOTE — Progress Notes (Signed)
 E-Visit for Urinary Problems  We are sorry that you are not feeling well.  Here is how we plan to help!  Based on what you shared with me it looks like you most likely have a simple urinary tract infection.  A UTI (Urinary Tract Infection) is a bacterial infection of the bladder.  Most cases of urinary tract infections are simple to treat but a key part of your care is to encourage you to drink plenty of fluids and watch your symptoms carefully.  I have prescribed Bactrim DS One tablet twice a day for 5 days.  Your symptoms should gradually improve. Call us if the burning in your urine worsens, you develop worsening fever, back pain or pelvic pain or if your symptoms do not resolve after completing the antibiotic.  Urinary tract infections can be prevented by drinking plenty of water to keep your body hydrated.  Also be sure when you wipe, wipe from front to back and don't hold it in!  If possible, empty your bladder every 4 hours.  HOME CARE Drink plenty of fluids Compete the full course of the antibiotics even if the symptoms resolve Remember, when you need to go.go. Holding in your urine can increase the likelihood of getting a UTI! GET HELP RIGHT AWAY IF: You cannot urinate You get a high fever Worsening back pain occurs You see blood in your urine You feel sick to your stomach or throw up You feel like you are going to pass out  MAKE SURE YOU  Understand these instructions. Will watch your condition. Will get help right away if you are not doing well or get worse.   Thank you for choosing an e-visit.  Your e-visit answers were reviewed by a board certified advanced clinical practitioner to complete your personal care plan. Depending upon the condition, your plan could have included both over the counter or prescription medications.  Please review your pharmacy choice. Make sure the pharmacy is open so you can pick up prescription now. If there is a problem, you may contact  your provider through [employer] and have the prescription routed to another pharmacy.  Your safety is important to [place]. If you have drug allergies check your prescription carefully.   For the next 24 hours you can use MyChart to ask questions about today's visit, request a non-urgent call back, or ask for a work or school excuse. You will get an email in the next two days asking about your experience. I hope that your e-visit has been valuable and will speed your recovery.    I have spent 5 minutes in review of e-visit questionnaire, review and updating patient chart, medical decision making and response to patient.   Margaretann Loveless, PA-C

## 2023-10-05 ENCOUNTER — Encounter: Payer: Self-pay | Admitting: Family Medicine

## 2023-10-05 ENCOUNTER — Ambulatory Visit: Admitting: Family Medicine

## 2023-10-05 VITALS — BP 138/98 | HR 66 | Temp 98.3°F | Ht 64.0 in | Wt 173.8 lb

## 2023-10-05 DIAGNOSIS — M25552 Pain in left hip: Secondary | ICD-10-CM | POA: Diagnosis not present

## 2023-10-05 DIAGNOSIS — I10 Essential (primary) hypertension: Secondary | ICD-10-CM

## 2023-10-05 MED ORDER — MELOXICAM 15 MG PO TABS: 15.0000 mg | ORAL_TABLET | Freq: Every day | ORAL | 0 refills | Status: DC

## 2023-10-05 NOTE — Patient Instructions (Signed)
 It was very nice to see you today!  I think you have some inflammation in your SI joint.  Also probably have some weakness and inflammation in your muscles around your hip as well.  Work on the exercises.  Take meloxicam.  Let us know if not improving in the next 1 to 2 weeks.  Return if symptoms worsen or fail to improve.   Take care, Dr Jimmey Ralph  PLEASE NOTE:  If you had any lab tests, please let us know if you have not heard back within a few days. You may see your results on mychart before we have a chance to review them but we will give you a call once they are reviewed by [place].   If we ordered any referrals today, please let us know if you have not heard from their office within the next week.   If you had any urgent prescriptions sent in today, please check with the pharmacy within an hour of our visit to make sure the prescription was transmitted appropriately.   Please try these tips to maintain a healthy lifestyle:  Eat at least 3 REAL meals and 1-2 snacks per day.  Aim for no more than 5 hours between eating.  If you eat breakfast, please do so within one hour of getting up.   Each meal should contain half fruits/vegetables, one quarter protein, and one quarter carbs (no bigger than a computer mouse)  Cut down on sweet beverages. This includes juice, soda, and sweet tea.   Drink at least 1 glass of water with each meal and aim for at least 8 glasses per day  Exercise at least 150 minutes every week.

## 2023-10-05 NOTE — Progress Notes (Signed)
   Melissa Dawson is a 68 y.o. female who presents today for an office visit.  Assessment/Plan:  New/Acute Problems: Left hip pain Exam consistent with SI joint pain.  Also has some hip flexor weakness as well.  We discussed home exercise program and handout was given.  Will also start meloxicam for the next 1 to 2 weeks.  She will let us know if not improving and we can refer to PT or sports medicine.  We discussed reasons to return to care.  Follow-up as needed.  Rash Improving since her last visit.  Likely due to molluscum contagiosum.  She will let us know if this does not continue to improve over the next several weeks.  Chronic Problems Addressed Today: Hypertension Elevated today however her home readings are typically at goal.  May be elevated today in setting of acute pain as above.  She will monitor at home and let us know if persistently elevated.    Subjective:  HPI:  See Assessment / plan for status of chronic conditions. Patient here with back pain. Started a few weeks ago. Located in left lower back. Started playing tennis and picking up a ball. She has not noticed any obviously injuries or precipitating events. Pain radiates into her left groin. Tried ibuprofen which did not help. No obvious aggravating or alleviating factors.    I last saw her about a month ago for rash.  She has had improvement though still persistent on her lower abdomen.       Objective:  Physical Exam: BP (!) 138/98   Pulse 66   Temp 98.3 F (36.8 C) (Temporal)   Ht 5\' 4"  (1.626 m)   Wt 173 lb 12.8 oz (78.8 kg)   SpO2 99%   BMI 29.83 kg/m   Gen: No acute distress, resting comfortably CV: Regular rate and rhythm with no murmurs appreciated Pulm: Normal work of breathing, clear to auscultation bilaterally with no crackles, wheezes, or rhonchi MUSCULOSKELETAL: - Back: No deformities.  Nontender to palpation - Legs: No deformities.  Full range of motion throughout.  Neurovascular  intact distally.  Left hip with slight weakness with resisted flexion.  Pain elicited in SI joint with FABER maneuver on the left. Skin: Healing rash on lower abdomen. Neuro: Grossly normal, moves all extremities Psych: Normal affect and thought content      Avangeline Stockburger M. Jimmey Ralph, MD 10/05/2023 3:01 PM

## 2023-11-02 DIAGNOSIS — L578 Other skin changes due to chronic exposure to nonionizing radiation: Secondary | ICD-10-CM | POA: Diagnosis not present

## 2023-11-02 DIAGNOSIS — L821 Other seborrheic keratosis: Secondary | ICD-10-CM | POA: Diagnosis not present

## 2023-11-02 DIAGNOSIS — L820 Inflamed seborrheic keratosis: Secondary | ICD-10-CM | POA: Diagnosis not present

## 2023-11-22 DIAGNOSIS — Z1231 Encounter for screening mammogram for malignant neoplasm of breast: Secondary | ICD-10-CM | POA: Diagnosis not present

## 2023-11-22 LAB — HM MAMMOGRAPHY

## 2023-11-24 ENCOUNTER — Encounter: Payer: Self-pay | Admitting: Family Medicine

## 2024-01-08 ENCOUNTER — Encounter: Payer: Self-pay | Admitting: Family Medicine

## 2024-01-08 ENCOUNTER — Ambulatory Visit: Payer: Self-pay | Admitting: Family Medicine

## 2024-01-08 ENCOUNTER — Ambulatory Visit (INDEPENDENT_AMBULATORY_CARE_PROVIDER_SITE_OTHER): Payer: No Typology Code available for payment source | Admitting: Family Medicine

## 2024-01-08 VITALS — BP 138/88 | HR 67 | Temp 97.8°F | Ht 64.0 in | Wt 172.0 lb

## 2024-01-08 DIAGNOSIS — E785 Hyperlipidemia, unspecified: Secondary | ICD-10-CM | POA: Diagnosis not present

## 2024-01-08 DIAGNOSIS — Z131 Encounter for screening for diabetes mellitus: Secondary | ICD-10-CM

## 2024-01-08 DIAGNOSIS — Z0000 Encounter for general adult medical examination without abnormal findings: Secondary | ICD-10-CM

## 2024-01-08 DIAGNOSIS — I10 Essential (primary) hypertension: Secondary | ICD-10-CM

## 2024-01-08 DIAGNOSIS — E663 Overweight: Secondary | ICD-10-CM | POA: Diagnosis not present

## 2024-01-08 DIAGNOSIS — Z780 Asymptomatic menopausal state: Secondary | ICD-10-CM | POA: Diagnosis not present

## 2024-01-08 LAB — LIPID PANEL
Cholesterol: 225 mg/dL — ABNORMAL HIGH (ref 0–200)
HDL: 92.4 mg/dL (ref >39.00)
LDL Cholesterol: 120 mg/dL — ABNORMAL HIGH (ref 0–99)
NonHDL: 132.18
Total CHOL/HDL Ratio: 2
Triglycerides: 60 mg/dL (ref 0.0–149.0)
VLDL: 12 mg/dL (ref 0.0–40.0)

## 2024-01-08 LAB — CBC WITH DIFFERENTIAL/PLATELET
Basophils Absolute: 0 10*3/uL (ref 0.0–0.1)
Basophils Relative: 0.8 % (ref 0.0–3.0)
Eosinophils Absolute: 0.1 10*3/uL (ref 0.0–0.7)
Eosinophils Relative: 3.5 % (ref 0.0–5.0)
HCT: 41.9 % (ref 36.0–46.0)
Hemoglobin: 14.4 g/dL (ref 12.0–15.0)
Lymphocytes Relative: 31.4 % (ref 12.0–46.0)
Lymphs Abs: 1.3 10*3/uL (ref 0.7–4.0)
MCHC: 34.4 g/dL (ref 30.0–36.0)
MCV: 91 fl (ref 78.0–100.0)
Monocytes Absolute: 0.3 10*3/uL (ref 0.1–1.0)
Monocytes Relative: 6.4 % (ref 3.0–12.0)
Neutro Abs: 2.4 10*3/uL (ref 1.4–7.7)
Neutrophils Relative %: 57.9 % (ref 43.0–77.0)
Platelets: 310 10*3/uL (ref 150.0–400.0)
RBC: 4.6 Mil/uL (ref 3.87–5.11)
RDW: 12.6 % (ref 11.5–15.5)
WBC: 4.2 10*3/uL (ref 4.0–10.5)

## 2024-01-08 LAB — COMPREHENSIVE METABOLIC PANEL WITH GFR
ALT: 20 U/L (ref 0–35)
AST: 22 U/L (ref 0–37)
Albumin: 4.6 g/dL (ref 3.5–5.2)
Alkaline Phosphatase: 76 U/L (ref 39–117)
BUN: 13 mg/dL (ref 6–23)
CO2: 30 meq/L (ref 19–32)
Calcium: 9.5 mg/dL (ref 8.4–10.5)
Chloride: 100 meq/L (ref 96–112)
Creatinine, Ser: 0.66 mg/dL (ref 0.40–1.20)
GFR: 91.97 mL/min (ref >60.00)
Glucose, Bld: 94 mg/dL (ref 70–99)
Potassium: 4.4 meq/L (ref 3.5–5.1)
Sodium: 137 meq/L (ref 135–145)
Total Bilirubin: 0.6 mg/dL (ref 0.2–1.2)
Total Protein: 7.2 g/dL (ref 6.0–8.3)

## 2024-01-08 LAB — HEMOGLOBIN A1C: Hgb A1c MFr Bld: 5.5 % (ref 4.6–6.5)

## 2024-01-08 NOTE — Progress Notes (Signed)
 Phone [phone]   Subjective:  Patient presents today for their annual physical. Chief complaint-noted.   See problem oriented charting- ROS- full  review of systems was completed and negative Per full ROS sheet completed by patient except for topics noted under acute/chronic concerns  The following were reviewed and entered/updated in epic: Past Medical History:  Diagnosis Date   Arthritis    cervical (neck)   Hypertension    Patient Active Problem List   Diagnosis Date Noted   Hypertension 01/15/2013    Priority: Medium    Abdominal bruit 05/19/2014    Priority: Low   Numbness of left foot 05/19/2014    Priority: Low   Past Surgical History:  Procedure Laterality Date   CESAREAN SECTION     DILATION AND CURETTAGE OF UTERUS  may 2011   fibroids and polyps    Family History  Problem Relation Age of Onset   Diabetes Father 49       heavy drinker   Lung cancer Father        smoker    Medications- reviewed and updated No current outpatient medications on file.   No current facility-administered medications for this visit.    Allergies-reviewed and updated Allergies  Allergen Reactions   Amoxicillin  Rash    Pt reported rash on back     Social History   Social History Narrative   Remarried may 2023.    Prior Divorced 2021-01-30. Separated mid Jan 31, 2016 (ex spouse died in Jan 30, 2021 alcoholic cirrhosis complications- married 1984/01/31). 2 daughters. 1 daughter in [place]-  1 granddaughter Ileana Mallard in april 2022. Other daughter in [place] cannot have children due to prior radiation.     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 Oropharynx normal Neck: no thyromegaly CV: RRR no murmurs rubs or gallops Lungs: CTAB no crackles, wheeze, rhonchi Abdomen: soft/nontender/nondistended/normal bowel sounds. No rebound or guarding.  Ext: no edema Skin: warm, dry Neuro: grossly normal, moves all extremities, PERRLA   Assessment and Plan   68 y.o. female presenting for annual physical.  Health Maintenance counseling: 1. Anticipatory guidance: Patient counseled regarding regular dental exams -q6 months, eye exams - yearly,  avoiding smoking and second hand smoke , limiting alcohol to 1 beverage per day- 1 a week or so , no illicit drugs .   2. Risk factor reduction:  Advised patient of need for regular exercise and diet rich and fruits and vegetables to reduce risk of heart attack and stroke.  Exercise-when she was doing frequent tennis doubles and some classes at the [facility]- harder with travel but getting some golf in- wants to try to be intentional with classes when at home and when joints tolerate it. May try to add padding at knee- possible osgood schlatter. Motivated by fun activities. Doing some tennis.  Diet/weight management-weight largely stable from last year-we have discussed mild weight lossand healthy eating/really exercise- trying to work around travel situatoin.  Wt Readings  from Last 3 Encounters:  01/08/24 172 lb (78 kg)  10/05/23 173 lb 12.8 oz (78.8 kg)  09/05/23 175 lb (79.4 kg)  3. Immunizations/screenings/ancillary studies-fully up-to-date other than Prevnar 20 and has recently recovered from illness so holding off until next year at least  Immunization History  Administered Date(s) Administered   Influenza Whole 05/02/2011   Influenza, High Dose Seasonal PF 07/10/2023   Influenza,inj,Quad PF,6+ Mos 05/18/2016, 07/07/2018   Influenza-Unspecified 05/15/2014, 06/03/2017, 07/07/2018   Moderna Covid-19 Fall Seasonal Vaccine 85yrs & older  07/10/2023   PFIZER(Purple Top)SARS-COV-2 Vaccination 10/04/2019, 11/05/2019, 08/09/2020   Pfizer Covid-19 Vaccine Bivalent Booster 50yrs & up 04/01/2022   Td 08/01/1997, 08/10/2010   Tdap 10/15/2020   Zoster Recombinant(Shingrix ) 01/03/2022, 03/15/2022  4. Cervical cancer screening- appears to have seen GYN on 11/23/2021.  Cryotherapy age 43 but no significant issues since that time. Pap 11/23/2021 on file reported normal on abstraction- basically was told optional - has not been back since then 5. Breast cancer screening-  mammogram 11/22/23 6. Colon cancer screening -02/03/2021 Cologuard with repeat due next month-she will reach out in August if hasn't received one as has a lot of travel upcoming 7. Skin cancer screening-follows with Dr. Daved Eriksson with next visit planned in 2026 -3 years out with good report- had update last month itching on scalp. advised regular sunscreen use. Denies worrisome, changing, or new skin lesions.  8. Birth control/STD check- monogamous so declines STD screening.  Postmenopausal. 9. Osteoporosis screening at 65- we ordered bone density today  10. Smoking associated screening -never smoker  Status of chronic or acute concerns   #social update- enjoying retirement and has had some really cool trips like [place] and [place]  upcoming  #hypertension S: medication: None, previously on amlodipine  2.5 mg-she wanted to come off medication to see if she could eat more grapefruit which she really enjoys Home readings #s: Has home cuff- no recent checks but can be around 140 A/P: high acceptable today - encouraged monitoring at least every 2 weeks on a low stress week and if trending up we can reconsider medication  #hyperlipidemia S: Medication:none  The 10-year ASCVD risk score (Arnett DK, et al., 2019) is: 5.7%* (Cholesterol units were assumed)  Lab Results  Component Value Date   CHOL 239 (A) 12/01/2022   HDL 89.90 01/03/2022   LDLCALC 103 (H) 01/03/2022   LDLDIRECT 93.8  08/10/2010   TRIG 70 12/01/2022   CHOLHDL 2 01/03/2022   A/P: ASCVD risk not substantially elevated so we opted to hold off on medicine or CT calcium scoring.    # Left hip pain-saw Dr. Daneil Dunker in March-started on meloxicam  and given home exercise program particularly for hip flexor weakness.  She did also mention this at last physical-seem to have some SI joint inflammation as well as mild trochanteric bursitis at that time. Has had issues in both knees as well as both hips in the past. Mild flares in rotating areas at this point but tolerating  #Rash- Had also previously seen Dr. Tanda Falter for rash thought to be molluscum contagiosum versus shingles. Has healed completely  Recommended follow up: Return in about 1 year (around 01/07/2025) for physical or sooner if needed.Schedule b4 you leave.  Lab/Order associations: fasting   ICD-10-CM   1. Preventative health care  Z00.00 Comprehensive metabolic panel with GFR    CBC with Differential/Platelet    Lipid panel    2. Primary hypertension  I10 Comprehensive metabolic panel with GFR    CBC with  Differential/Platelet    Lipid panel    3. Hyperlipidemia, unspecified hyperlipidemia type  E78.5     4. Screening for diabetes mellitus  Z13.1 Hemoglobin A1c    5. Overweight  E66.3 Hemoglobin A1c    6. Postmenopausal  Z78.0 DG Bone Density      No orders of the defined types were placed in this encounter.   Return precautions advised.  Clarisa Crooked, MD

## 2024-01-08 NOTE — Patient Instructions (Addendum)
 Please stop by lab before you go If you have mychart- we will send your results within 3 business days of us  receiving them.  If you do not have mychart- we will call you about results within 5 business days of us  receiving them.  *please also note that you will see labs on mychart as soon as they post. I will later go in and write notes on them- will say "notes from Dr. Arlene Ben"   Prevnar 20 in future  high acceptable blood pressure  today - encouraged monitoring at least every 2 weeks on a low stress week and if trending up we can reconsider medication. Would prefer <135/85 if possible  Schedule your bone density test at check out desk.  - located [address] across the street from [place] - in the basement - you DO NEED an appointment for the bone density tests.    Recommended follow up: Return in about 1 year (around 01/07/2025) for physical or sooner if needed.Schedule b4 you leave. Optional welcome to medicare with me but wou ldhave to do 11/18 or before

## 2024-02-14 ENCOUNTER — Ambulatory Visit
Admission: RE | Admit: 2024-02-14 | Discharge: 2024-02-14 | Disposition: A | Discharge status: Home / Self Care | Source: Ambulatory Visit | Attending: Family Medicine | Admitting: Family Medicine

## 2024-02-14 DIAGNOSIS — Z780 Asymptomatic menopausal state: Secondary | ICD-10-CM | POA: Diagnosis not present

## 2025-01-13 ENCOUNTER — Encounter: Admitting: Family Medicine

## 2025-01-21 ENCOUNTER — Encounter: Admitting: Family Medicine
